# Patient Record
Sex: Female | Born: 1940
Health system: Southern US, Community
[De-identification: ages and names within clinical notes are randomized; demographics above are authoritative.]

## PROBLEM LIST (undated history)

## (undated) DIAGNOSIS — R55 Syncope and collapse: Secondary | ICD-10-CM

## (undated) DIAGNOSIS — R011 Cardiac murmur, unspecified: Secondary | ICD-10-CM

## (undated) DIAGNOSIS — H532 Diplopia: Secondary | ICD-10-CM

## (undated) DIAGNOSIS — G40909 Epilepsy, unspecified, not intractable, without status epilepticus: Secondary | ICD-10-CM

## (undated) DIAGNOSIS — F329 Major depressive disorder, single episode, unspecified: Secondary | ICD-10-CM

## (undated) DIAGNOSIS — F32A Depression, unspecified: Secondary | ICD-10-CM

## (undated) DIAGNOSIS — H9190 Unspecified hearing loss, unspecified ear: Secondary | ICD-10-CM

## (undated) DIAGNOSIS — R32 Unspecified urinary incontinence: Secondary | ICD-10-CM

## (undated) DIAGNOSIS — C801 Malignant (primary) neoplasm, unspecified: Secondary | ICD-10-CM

## (undated) DIAGNOSIS — M419 Scoliosis, unspecified: Secondary | ICD-10-CM

## (undated) DIAGNOSIS — R413 Other amnesia: Secondary | ICD-10-CM

## (undated) DIAGNOSIS — R269 Unspecified abnormalities of gait and mobility: Secondary | ICD-10-CM

## (undated) DIAGNOSIS — C3490 Malignant neoplasm of unspecified part of unspecified bronchus or lung: Secondary | ICD-10-CM

## (undated) DIAGNOSIS — G35 Multiple sclerosis: Principal | ICD-10-CM

## (undated) DIAGNOSIS — E7800 Pure hypercholesterolemia, unspecified: Secondary | ICD-10-CM

## (undated) DIAGNOSIS — J449 Chronic obstructive pulmonary disease, unspecified: Secondary | ICD-10-CM

## (undated) HISTORY — DX: Epilepsy, unspecified, not intractable, without status epilepticus: G40.909

## (undated) HISTORY — DX: Other amnesia: R41.3

## (undated) HISTORY — DX: Depression, unspecified: F32.A

## (undated) HISTORY — DX: Unspecified hearing loss, unspecified ear: H91.90

## (undated) HISTORY — DX: Malignant neoplasm of unspecified part of unspecified bronchus or lung: C34.90

## (undated) HISTORY — DX: Major depressive disorder, single episode, unspecified: F32.9

## (undated) HISTORY — DX: Unspecified urinary incontinence: R32

## (undated) HISTORY — DX: Diplopia: H53.2

## (undated) HISTORY — DX: Unspecified abnormalities of gait and mobility: R26.9

## (undated) HISTORY — DX: Scoliosis, unspecified: M41.9

## (undated) HISTORY — DX: Cardiac murmur, unspecified: R01.1

## (undated) HISTORY — DX: Pure hypercholesterolemia, unspecified: E78.00

## (undated) HISTORY — DX: Syncope and collapse: R55

---
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
## 1998-03-03 ENCOUNTER — Other Ambulatory Visit: Admission: RE | Admit: 1998-03-03 | Discharge: 1998-03-03 | Payer: Self-pay | Admitting: Neurology

## 2000-04-14 ENCOUNTER — Other Ambulatory Visit: Admission: RE | Admit: 2000-04-14 | Discharge: 2000-04-14 | Payer: Self-pay | Admitting: Family Medicine

## 2000-07-22 ENCOUNTER — Encounter (HOSPITAL_COMMUNITY): Admission: RE | Admit: 2000-07-22 | Discharge: 2000-10-20 | Payer: Self-pay | Admitting: *Deleted

## 2000-07-22 ENCOUNTER — Encounter: Admission: RE | Admit: 2000-07-22 | Discharge: 2000-08-21 | Payer: Self-pay

## 2004-11-25 HISTORY — PX: CHOLECYSTECTOMY: SHX55

## 2005-08-04 ENCOUNTER — Inpatient Hospital Stay (HOSPITAL_COMMUNITY): Admission: EM | Admit: 2005-08-04 | Discharge: 2005-08-07 | Payer: Self-pay | Admitting: Emergency Medicine

## 2005-08-06 ENCOUNTER — Encounter (INDEPENDENT_AMBULATORY_CARE_PROVIDER_SITE_OTHER): Payer: Self-pay | Admitting: Specialist

## 2005-09-20 ENCOUNTER — Other Ambulatory Visit: Admission: RE | Admit: 2005-09-20 | Discharge: 2005-09-20 | Payer: Self-pay | Admitting: Family Medicine

## 2005-11-25 HISTORY — PX: ABDOMINAL HYSTERECTOMY: SHX81

## 2006-01-15 ENCOUNTER — Encounter: Payer: Self-pay | Admitting: Emergency Medicine

## 2006-01-16 ENCOUNTER — Inpatient Hospital Stay (HOSPITAL_COMMUNITY): Admission: EM | Admit: 2006-01-16 | Discharge: 2006-01-24 | Payer: Self-pay | Admitting: Internal Medicine

## 2006-01-16 ENCOUNTER — Ambulatory Visit: Payer: Self-pay | Admitting: Internal Medicine

## 2006-11-25 HISTORY — PX: OVARIAN CYST REMOVAL: SHX89

## 2007-03-13 ENCOUNTER — Encounter: Admission: RE | Admit: 2007-03-13 | Discharge: 2007-03-13 | Payer: Self-pay | Admitting: Orthopaedic Surgery

## 2007-07-06 ENCOUNTER — Ambulatory Visit: Payer: Self-pay | Admitting: Psychology

## 2007-07-06 ENCOUNTER — Encounter: Admission: RE | Admit: 2007-07-06 | Discharge: 2007-07-22 | Payer: Self-pay | Admitting: Psychiatry

## 2007-07-06 ENCOUNTER — Encounter: Admission: RE | Admit: 2007-07-06 | Discharge: 2007-10-04 | Payer: Self-pay | Admitting: Psychology

## 2008-06-15 ENCOUNTER — Encounter: Admission: RE | Admit: 2008-06-15 | Discharge: 2008-08-17 | Payer: Self-pay | Admitting: *Deleted

## 2009-07-18 ENCOUNTER — Encounter: Admission: RE | Admit: 2009-07-18 | Discharge: 2009-10-02 | Payer: Self-pay | Admitting: Psychiatry

## 2010-11-06 ENCOUNTER — Encounter
Admission: RE | Admit: 2010-11-06 | Discharge: 2010-11-06 | Payer: Self-pay | Source: Home / Self Care | Attending: Geriatric Medicine | Admitting: Geriatric Medicine

## 2010-11-25 HISTORY — PX: LUNG BIOPSY: SHX232

## 2011-03-11 ENCOUNTER — Ambulatory Visit: Payer: Medicare Other | Attending: Orthopaedic Surgery | Admitting: Physical Therapy

## 2011-03-11 DIAGNOSIS — IMO0001 Reserved for inherently not codable concepts without codable children: Secondary | ICD-10-CM | POA: Insufficient documentation

## 2011-03-11 DIAGNOSIS — R262 Difficulty in walking, not elsewhere classified: Secondary | ICD-10-CM | POA: Insufficient documentation

## 2011-03-19 ENCOUNTER — Ambulatory Visit: Payer: Medicare Other | Admitting: Physical Therapy

## 2011-03-21 ENCOUNTER — Ambulatory Visit: Payer: Medicare Other | Admitting: Physical Therapy

## 2011-03-26 ENCOUNTER — Emergency Department (HOSPITAL_COMMUNITY): Payer: Medicare Other

## 2011-03-26 ENCOUNTER — Ambulatory Visit: Payer: BC Managed Care – PPO | Admitting: Physical Therapy

## 2011-03-26 ENCOUNTER — Emergency Department (HOSPITAL_COMMUNITY)
Admission: EM | Admit: 2011-03-26 | Discharge: 2011-03-27 | Disposition: A | Discharge status: Home / Self Care | Payer: Medicare Other | Attending: Emergency Medicine | Admitting: Emergency Medicine

## 2011-03-26 DIAGNOSIS — R0602 Shortness of breath: Secondary | ICD-10-CM | POA: Insufficient documentation

## 2011-03-26 DIAGNOSIS — G35 Multiple sclerosis: Secondary | ICD-10-CM | POA: Insufficient documentation

## 2011-03-26 DIAGNOSIS — R5381 Other malaise: Secondary | ICD-10-CM | POA: Insufficient documentation

## 2011-03-26 DIAGNOSIS — R4182 Altered mental status, unspecified: Secondary | ICD-10-CM | POA: Insufficient documentation

## 2011-03-26 DIAGNOSIS — I440 Atrioventricular block, first degree: Secondary | ICD-10-CM | POA: Insufficient documentation

## 2011-03-26 DIAGNOSIS — R209 Unspecified disturbances of skin sensation: Secondary | ICD-10-CM | POA: Insufficient documentation

## 2011-03-26 LAB — URINALYSIS, ROUTINE W REFLEX MICROSCOPIC
Bilirubin Urine: NEGATIVE
Ketones, ur: NEGATIVE mg/dL
Nitrite: NEGATIVE
Specific Gravity, Urine: 1.01 (ref 1.005–1.030)
Urobilinogen, UA: 0.2 mg/dL (ref 0.0–1.0)

## 2011-03-26 LAB — DIFFERENTIAL
Eosinophils Absolute: 0.3 10*3/uL (ref 0.0–0.7)
Lymphocytes Relative: 30 % (ref 12–46)
Lymphs Abs: 3.5 10*3/uL (ref 0.7–4.0)
Neutro Abs: 7 10*3/uL (ref 1.7–7.7)
Neutrophils Relative %: 60 % (ref 43–77)

## 2011-03-26 LAB — COMPREHENSIVE METABOLIC PANEL
BUN: 9 mg/dL (ref 6–23)
Calcium: 9.5 mg/dL (ref 8.4–10.5)
Glucose, Bld: 115 mg/dL — ABNORMAL HIGH (ref 70–99)
Sodium: 131 mEq/L — ABNORMAL LOW (ref 135–145)
Total Protein: 6.1 g/dL (ref 6.0–8.3)

## 2011-03-26 LAB — CBC
Hemoglobin: 16.3 g/dL — ABNORMAL HIGH (ref 12.0–15.0)
MCV: 96.1 fL (ref 78.0–100.0)
Platelets: 264 10*3/uL (ref 150–400)
RBC: 4.83 MIL/uL (ref 3.87–5.11)
WBC: 11.8 10*3/uL — ABNORMAL HIGH (ref 4.0–10.5)

## 2011-03-26 LAB — POCT CARDIAC MARKERS
CKMB, poc: <1 ng/mL — ABNORMAL LOW (ref 1.0–8.0)
Troponin i, poc: <0.05 ng/mL (ref 0.00–0.09)

## 2011-03-27 LAB — URINE CULTURE

## 2011-03-29 ENCOUNTER — Ambulatory Visit: Payer: Medicare Other | Attending: Orthopaedic Surgery | Admitting: Physical Therapy

## 2011-03-29 DIAGNOSIS — IMO0001 Reserved for inherently not codable concepts without codable children: Secondary | ICD-10-CM | POA: Insufficient documentation

## 2011-03-29 DIAGNOSIS — R262 Difficulty in walking, not elsewhere classified: Secondary | ICD-10-CM | POA: Insufficient documentation

## 2011-04-02 ENCOUNTER — Ambulatory Visit: Payer: Medicare Other | Admitting: Physical Therapy

## 2011-04-04 ENCOUNTER — Ambulatory Visit: Payer: Medicare Other | Admitting: Physical Therapy

## 2011-04-09 ENCOUNTER — Ambulatory Visit: Payer: Medicare Other | Admitting: Physical Therapy

## 2011-04-11 ENCOUNTER — Ambulatory Visit: Payer: Medicare Other | Admitting: Physical Therapy

## 2011-04-12 NOTE — Consult Note (Signed)
NAMEWENDOLYN, [name]               ACCOUNT NO.:  [id number]   MEDICAL RECORD NO.:  [id number]          PATIENT TYPE:  INP   LOCATION:  5003                         FACILITY:  [facility]   PHYSICIAN:  De Blanch, M.D.DATE OF BIRTH:  04/22/1941   DATE OF CONSULTATION:  01/21/2006  DATE OF DISCHARGE:                                   CONSULTATION   CHIEF COMPLAINT:  Abdominopelvic mass and abdominal pain.   A 70 year old white female admitted approximately a week ago with relatively  rapid onset of abdominal pain and abdominal distension. 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 She also had ascites  and adrenal nodules (the patient has a family history of two other siblings  with adrenal neoplasms which are benign). Following the hospital admission,  she has had fevers of uncertain origin and progressive respiratory decline.   The patient has a past history of multiple sclerosis which apparently has  been relatively stable over the past several years. She is primarily cared  for at [facility] in [place]. She also has longstanding chronic  obstructive pulmonary disease. Her O2 saturation throughout the admission  has worsened and workup including spiral CT to evaluate for pulmonary  embolism. Chest x-ray points towards worsening of her COPD. There is no  evidence of deep vein thrombosis or pulmonary embolus. She has 85%  saturation on 4 liters of nasal oxygen.   Infectious disease workup has suggested possibly a drug fever or tumor fever  as the etiology for her fever. Further, the patient is anemic with a  hematocrit 21%. She was initially admitted with an elevated white count of  close to 20,000 but has gradually declined to 14,000.   Today the patient complains of gastroesophageal reflux disease and not  having had a bowel movement in 2 days.   PAST  MEDICAL HISTORY:  Multiple sclerosis, emphysema, depression,  hyperlipidemia, mitral valve prolapse.   DRUG ALLERGIES:  SULFA.   PAST SURGICAL HISTORY:  Appendectomy, cholecystectomy, bilateral tubal  ligation.   CURRENT MEDICATIONS:  Are listed in the patient's medical record, are  reviewed and I have no additions. She is currently taking Zosyn.   SOCIAL HISTORY:  The patient has two Masters degrees, is divorced and comes  accompanied by her daughter. She quit smoking several weeks ago.   FAMILY HISTORY:  Negative for gynecologic, breast or colon cancer.   REVIEW OF SYSTEMS:  A 10-point comprehensive review of systems is performed  and is negative except as noted above.   OBSTETRICAL HISTORY:  Gravida 3, para 3-2-1.   She comes accompanied by her one living child today.   PHYSICAL EXAM:  VITAL SIGNS:  Temperature 100.2, pulse 80, respiratory rate  20.  GENERAL:  The patient is a pleasant, talkative white female in no acute  distress with nasal prongs in place.  HEENT:  Negative.  NECK:  Supple without thyromegaly. There is no supraclavicular or inguinal  adenopathy.  ABDOMEN:  The abdomen is distended. She  has a fluid wave and shifting  dullness. A discrete mass cannot be palpated because of the distension.  PELVIC:  EGBUS, vagina, bladder and urethra are normal. Cervix appears  normal. Bimanual exam and rectovaginal exam reveal a mass filling the pelvis  that is smooth and extends nearly to the umbilicus on bimanual examination.  This is minimally tender. There is no rebound  LOWER EXTREMITIES:  Without edema or varicosities.   IMPRESSION:  Complex pelvic mass with ascites. The patient's laboratory work  is reviewed. She is also anemic and has a slightly elevated white count. Her  CA-125 value is 12.4 units/mL.   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 Whether it is  malignant or benign is difficult to ascertain based on data available. Given  the  patient's symptoms, I believe she would benefit from surgical excision  of the mass with intraoperative frozen section and staging if this turned  out to be an ovarian cancer.   The complicating issues are her medical comorbidities including respiratory  compromise and multiple sclerosis. Given the high probability of the need  for postoperative intubation, I believe the patient would be best managed in  a major [facility]. As it turns out, she has two specialist  at [facility] in [place] and I would therefore recommend that  they be involved with managing her medical care.   The patient and her daughter are in agreement with this recommendation.      De Blanch, M.D.  Electronically Signed     DC/MEDQ  D:  01/21/2006  T:  01/22/2006  Job:  [id number]   cc:   Telford Nab, R.N.  [address]  [place], [place] [address]

## 2011-04-12 NOTE — Discharge Summary (Signed)
[name], [name]               ACCOUNT NO.:  [id number]   MEDICAL RECORD NO.:  [id number]          PATIENT TYPE:  INP   LOCATION:  5003                         FACILITY:  [facility]   PHYSICIAN:  Hollice Espy, M.D.DATE OF BIRTH:  1941-08-05   DATE OF ADMISSION:  01/16/2006  DATE OF DISCHARGE:  01/24/2006                                 DISCHARGE SUMMARY   ADDENDUM:  The patient's accepting physician at [facility] is Hazle Coca, M.D.   CONSULTATIONS:  Cliffton Asters, M.D., infectious disease.   DISCHARGE ADDENDUM:  The patient was initially scheduled to be transferred  to [facility] on January 23, 2006.  However, the issue of bed  availability at [facility] became an issue and the patient needed to  stay at least one additional day.  According to the patient's nurses,  nursing reports have called for sign-out on March 2, so the anticipated plan  is for transfer today, January 24, 2006.  The patient had no issues during the  additional hospital day except she did notice some occasional left arm  numbness.  She was concerned about her multiple sclerosis recurring, and so  her medications are being resumed, specifically Neurontin, Klonopin and  Betaseron.   Please note that on review of her discharge summary, her Betaseron was left  off of her medication list.  Please add this addendum medication to her list  as:   Betaseron 0.25 mg subcu every other day.  It is being given today, January 24, 2006.  Next dose scheduled is January 26, 2006.   The patient is otherwise doing well and felt to be medically stable for  discharge to [facility] as soon as a bed is available.      Hollice Espy, M.D.  Electronically Signed     SKK/MEDQ  D:  01/24/2006  T:  01/24/2006  Job:  [id number]

## 2011-04-12 NOTE — Op Note (Signed)
NAMEMIKELE, [name]               ACCOUNT NO.:  [id number]   MEDICAL RECORD NO.:  [id number]          PATIENT TYPE:  INP   LOCATION:  1619                         FACILITY:  [facility]   PHYSICIAN:  Vikki Ports, MDDATE OF BIRTH:  03-21-1941   DATE OF PROCEDURE:  08/06/2005  DATE OF DISCHARGE:                                 OPERATIVE REPORT   PREOPERATIVE DIAGNOSIS:  Cholelithiasis and choledocholithiasis.   POSTOPERATIVE DIAGNOSIS:  Cholelithiasis and choledocholithiasis.   PROCEDURE:  Laparoscopic cholecystectomy with intraoperative cholangiogram.   ASSISTANT:  Thornton Park. Daphine Deutscher, M.D.   ANESTHESIA:  General.   DESCRIPTION:  The patient was taken to the operating room and placed in a  supine position.  After adequate general anesthesia was induced using  endotracheal tube, the abdomen was prepped and draped in normal sterile  fashion.  Using a transverse infraumbilical incision, I dissected down to  the fascia.  Fascia was opened vertically and an 0 Vicryl pursestring suture  was placed around the fascial defect.  The Hasson trocar was placed in the  abdomen and pneumoperitoneum was obtained.  An 11 mm trocar was placed in  the subxiphoid region two 5 mm trocars were placed in right abdomen.  A  number of adhesions were taken down off the gallbladder and the gallbladder  was retracted cephalad.  A very large common duct was identified, as was a  fairly wide cystic duct.  Tedious dissection around the cystic duct  identified it at its junction with the gallbladder and the common duct.  It  was clipped proximally and a small ductotomy was made.  Cholangiogram was  performed which showed some probable sludge, which cleared into the  duodenum, a widened common bile duct but no evidence of filling defects.  The cystic duct was then triply clipped and divided as was the cystic  artery, which was dissected free in Calot's triangle in a similar fashion.  The gallbladder was taken  off the gallbladder bed using Bovie electrocautery  and removed through the umbilical port.  Of note, the gallbladder had to be  opened and some stones removed to remove it through the fascia.  The  incision was copiously irrigated.  Adequate hemostasis was assured.  Pneumoperitoneum was released.  Trocars were removed.  The fascial defect  was closed.  Skin incisions were closed with subcuticular 4-0 Monocryl.  Steri-Strips and sterile dressings were applied.  The patient tolerated the  procedure well and went to PACU in good condition.      Vikki Ports, MD  Electronically Signed     KRH/MEDQ  D:  08/06/2005  T:  08/07/2005  Job:  [id number]

## 2011-04-12 NOTE — H&P (Signed)
[name], [name]               ACCOUNT NO.:  [id number]   MEDICAL RECORD NO.:  [id number]          PATIENT TYPE:  INP   LOCATION:  NA                           FACILITY:  [facility]   PHYSICIAN:  Hollice Espy, M.D.DATE OF BIRTH:  06/09/1941   DATE OF ADMISSION:  01/16/2006  DATE OF DISCHARGE:                                HISTORY & PHYSICAL   CHIEF COMPLAINT:  Abdominal pain.   HISTORY OF PRESENT ILLNESS:  The patient is a 70 year old white female with  past medical history of mitral valve prolapse, hyperlipidemia, and multiple  sclerosis, as well as chronic obstructive pulmonary disease, who was sent  over from her primary care physicians office after having complaints of  abdominal pain, nausea and vomiting. The patient tells me that her appetite  has been off now for several weeks. Normally her bowels have been moving  well but over the last week, have not been moving at all. They have been  getting worse and worse. Her last full bowel movement that she could recall,  her last even small bowel movement, was at least 4 days ago and her last  major bowel movement was over a week ago. She started having significant  nausea and vomiting and her primary care physician was concerned about a  possible acute abdomen. He had the patient sent over by EMS here. Once in  the emergency room, the patient has noted to have significant constipation  by her abdominal x-ray. She received an enema and a significant bowel  movement. ER attending ordered a CT of the abdomen and pelvis and patient  was found on her pelvic CT, unfortunately, to have a very large pelvic mass  near the midline, measuring 11.6 x 10.7 x 15.5, looking multi-loculated and  cystic, compatible with ovarian cancer. Also of concern, were bilateral  adrenal nodules and ascites, concerning for metastasis. The patient in  addition was noted to have a white count of 20.4 with 92% shift. The rest of  her labs were essentially  unremarkable. Currently the patient, even after  having a bowel movement, still complains of some moderate abdominal  discomfort.   REVIEW OF SYSTEMS:  She denies any headaches, vision changes, dysphagia,  chest pain, palpitations, shortness of breath, wheezing, coughing. She  denies any hematuria, dysuria, no diarrhea. No focal extremity numbness,  weakness, or pain that is acute. Review of systems is otherwise negative.   PAST MEDICAL HISTORY:  Multiple sclerosis, emphysema, depression,  hyperlipidemia, and mitral valve prolapse.   PAST SURGICAL HISTORY:  Status post appendectomy and gallbladder surgery.  History of bilateral tubal ligation.   MEDICATIONS:  The patient is unsure of the doses of her medications. She is  on Wellbutrin, Aricept, Neurontin, Klonopin, Lipitor, Tricor, Trazodone,  Ibuprofen, Cymbalta, Provigil, and a medicine called Tysabri. She gets her  medications at the [facility].   ALLERGIES:  SULFA.   SOCIAL HISTORY:  She quit smoking a few weeks ago. She denies any alcohol or  drug use.   FAMILY HISTORY:  Notable for cancer, diabetes, heart disease, and  hypertension.   PHYSICAL EXAMINATION:  VITAL SIGNS:  On admission temperature 98.4, heart  rate 78, blood pressure 147/92, respiratory rate 20. O2 saturation 90% on 2  liters.  GENERAL:  Alert and oriented times three. In some mild distress secondary to  abdominal pain.  HEENT:  Normocephalic and atraumatic. Slightly dry mucous membranes.  NECK:  NO carotid bruits.  HEART:  Regular rate and rhythm with a small 2 out of 6 systolic ejection  murmur.  LUNGS:  She has some decreased breath sounds throughout.  ABDOMEN:  Distended, diffusely tender. Hypo-active bowel sounds and soft.  EXTREMITIES:  No clubbing, cyanosis. Trace pitting edema.   LABORATORY DATA:  White blood cell count 20.4 with a 90% shift. Hemoglobin  and hematocrit 16.4 and 47.9. MCV of 100, platelet count 259,000. Lipase   level 16. Liver function studies are normal. Sodium 131, potassium 4.1,  chloride 102, bicarb 22, BUN 14, creatinine 0.8, glucose 148. UA shows only  trace ketones.   ASSESSMENT/PLAN:  1.  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      Will check a CA125 level and in the morning, will consult Dr. Stanford Breed of Gynecologic Oncology Surgery.  2.  Leukocytosis, likely secondary to a stress reaction from large abdominal      mass. Will repeat her labs and follow. At this time, I do not see any      signs of indications of acute infection. She has a negative chest x-ray      and UA.  3.  Constipation. Will treat with stool softener and a Fleets enema. Will      try to sparingly use narcotics for her pain, given that this is further      leading to more constipation.  4.  Multiple sclerosis. The patient is unsure of her medications. Once her      pharmacy is open and the primary care physician's office is open, will      touch base with them and get her doses of her medicines.  5.  Hyperlipidemia.  6.  Depression.  7.  Emphysema.      Hollice Espy, M.D.  Electronically Signed     SKK/MEDQ  D:  01/16/2006  T:  01/16/2006  Job:  [id number]   cc:   Dellis Anes. Idell Pickles, M.D.  Fax: [phone]   De Blanch, M.D.  [address]  [place]  [place] [address]

## 2011-04-26 ENCOUNTER — Ambulatory Visit: Payer: Medicare Other | Attending: Orthopaedic Surgery | Admitting: Occupational Therapy

## 2011-04-26 DIAGNOSIS — R262 Difficulty in walking, not elsewhere classified: Secondary | ICD-10-CM | POA: Insufficient documentation

## 2011-04-26 DIAGNOSIS — IMO0001 Reserved for inherently not codable concepts without codable children: Secondary | ICD-10-CM | POA: Insufficient documentation

## 2011-04-30 ENCOUNTER — Ambulatory Visit: Payer: Medicare Other | Admitting: Occupational Therapy

## 2011-05-07 ENCOUNTER — Encounter: Payer: BLUE CROSS/BLUE SHIELD | Admitting: Occupational Therapy

## 2011-05-08 ENCOUNTER — Encounter: Payer: BLUE CROSS/BLUE SHIELD | Admitting: Occupational Therapy

## 2011-05-14 ENCOUNTER — Ambulatory Visit: Payer: Medicare Other | Admitting: Occupational Therapy

## 2011-05-16 ENCOUNTER — Encounter: Payer: BLUE CROSS/BLUE SHIELD | Admitting: Occupational Therapy

## 2011-05-21 ENCOUNTER — Ambulatory Visit: Payer: Medicare Other | Admitting: Occupational Therapy

## 2011-05-23 ENCOUNTER — Encounter: Payer: BLUE CROSS/BLUE SHIELD | Admitting: Occupational Therapy

## 2011-06-05 ENCOUNTER — Encounter: Payer: BLUE CROSS/BLUE SHIELD | Admitting: Occupational Therapy

## 2011-06-27 ENCOUNTER — Other Ambulatory Visit: Payer: Self-pay | Admitting: Pulmonary Disease

## 2011-06-27 DIAGNOSIS — R9389 Abnormal findings on diagnostic imaging of other specified body structures: Secondary | ICD-10-CM

## 2011-07-11 ENCOUNTER — Ambulatory Visit
Admission: RE | Admit: 2011-07-11 | Discharge: 2011-07-11 | Disposition: A | Discharge status: Home / Self Care | Payer: Medicare Other | Source: Ambulatory Visit | Attending: Pulmonary Disease | Admitting: Pulmonary Disease

## 2011-07-11 DIAGNOSIS — R9389 Abnormal findings on diagnostic imaging of other specified body structures: Secondary | ICD-10-CM

## 2011-09-04 ENCOUNTER — Other Ambulatory Visit: Payer: Self-pay | Admitting: Geriatric Medicine

## 2011-09-04 DIAGNOSIS — R748 Abnormal levels of other serum enzymes: Secondary | ICD-10-CM

## 2011-09-06 ENCOUNTER — Ambulatory Visit
Admission: RE | Admit: 2011-09-06 | Discharge: 2011-09-06 | Disposition: A | Discharge status: Home / Self Care | Payer: Medicare Other | Source: Ambulatory Visit | Attending: Geriatric Medicine | Admitting: Geriatric Medicine

## 2011-09-06 ENCOUNTER — Other Ambulatory Visit: Payer: Self-pay | Admitting: Geriatric Medicine

## 2011-09-06 DIAGNOSIS — R748 Abnormal levels of other serum enzymes: Secondary | ICD-10-CM

## 2011-10-10 DIAGNOSIS — Q2112 Patent foramen ovale: Secondary | ICD-10-CM | POA: Insufficient documentation

## 2012-07-27 ENCOUNTER — Ambulatory Visit (HOSPITAL_BASED_OUTPATIENT_CLINIC_OR_DEPARTMENT_OTHER): Payer: Medicare Other | Attending: Pulmonary Disease

## 2012-07-27 VITALS — Ht 63.5 in | Wt 149.0 lb

## 2012-07-27 DIAGNOSIS — G4733 Obstructive sleep apnea (adult) (pediatric): Secondary | ICD-10-CM

## 2012-07-27 DIAGNOSIS — G4730 Sleep apnea, unspecified: Secondary | ICD-10-CM | POA: Insufficient documentation

## 2012-07-27 DIAGNOSIS — G4700 Insomnia, unspecified: Secondary | ICD-10-CM | POA: Insufficient documentation

## 2012-08-08 DIAGNOSIS — G4730 Sleep apnea, unspecified: Secondary | ICD-10-CM

## 2012-08-08 DIAGNOSIS — G4700 Insomnia, unspecified: Secondary | ICD-10-CM

## 2012-08-09 NOTE — Procedures (Signed)
[name], [name]               ACCOUNT NO.:  [id number]  MEDICAL RECORD NO.:  [id number]          PATIENT TYPE:  OUT  LOCATION:  SLEEP CENTER                 FACILITY:  [facility]  PHYSICIAN:  Clinton D. Maple Hudson, MD, FCCP, FACPDATE OF BIRTH:  [birth year] 03, 1941  DATE OF STUDY:  07/27/2012                           NOCTURNAL POLYSOMNOGRAM  REFERRING PHYSICIAN:  RODOLPHO PASCUAL  INDICATION FOR STUDY:  Insomnia with sleep apnea.  EPWORTH SLEEPINESS SCORE:  9/24.  BMI 25.8, weight 148 pounds, height 63.5 inches, neck 14 inches.  MEDICATIONS:  Home medications are charted and reviewed.  SLEEP ARCHITECTURE:  Total sleep time 339 minutes with sleep efficiency 92.9%.  Stage I at 3.4%, stage II 80.4%, stage III 5%, REM 11.2% of total sleep time.  Sleep latency 21 minutes, REM latency 303 minutes. Awake after sleep onset 5 minutes.  Arousal index 9.9.  Bedtime Medication:  Trazodone 150 mg.  RESPIRATORY DATA:  Apnea-hypopnea index (AHI) 3 per hour.  A total of 17 events was scored including 15 obstructive apneas and 2 hypopneas. Events were seen in all sleep positions.  REM/AHI of 7.9 per hour.  This is a diagnostic NPSG protocol.  OXYGEN DATA:  Moderate snoring.  The study was recorded with the patient wearing oxygen at 2 L/minute, applied at 23:11 p.m. because of desaturation.  Oxygen desaturation while wearing oxygen was to a nadir of 83% with mean oxygen saturation through the study of 88.8%.  During the total recording time, 304.6 minutes were recorded with saturation less than 90% and 25.8 minutes with saturation less than 88% despite supplemental oxygen.  CARDIAC DATA:  Normal sinus rhythm.  MOVEMENT-PARASOMNIA:  A total of 76 limb jerks were scored of which 15 were associated with arousals or awakenings for periodic limb movement with arousal index of 2.7 per hour.  No bathroom trips.  IMPRESSIONS-RECOMMENDATIONS: 1. Sleep pattern reflected medication with trazodone at bedtime,  noting     little awakening throughout the study and relatively flat sustained     stage II sleep. 2. Occasional respiratory event with sleep disturbance, within normal     limits.  Apnea-hypopnea index (AHI) 3 per hour - normal range for     adult is from 0-5 events per hour. 3. She arrived with room air oxygen saturation while awake at 93%.     Despite addition of supplemental oxygen at 2 L/minute at 11:11     p.m., she had significant oxygen desaturation to a nadir of 83%     with a mean saturation through the study of 88.8% and a total of     25.8 minutes with oxygen saturation less than 88%.  Recommend re-     evaluation of oxygen therapy during sleep.  She may require 3 or 4     L/minute while sleeping. 4. Periodic limb movement with arousal.  A total of 76 limb jerks were     counted, of which 15 were associated with arousals or awakenings     for periodic limb movement with arousal index of 2.7 per hour.  If     relief of nocturnal hypoxemia does not improve sleep quality     sufficiently, then  a trial of specific therapy such as Requip or     Mirapex might be considered if clinically appropriate.     Clinton D. Maple Hudson, MD, [facility], FACP Diplomate, American Board of Sleep Medicine    CDY/MEDQ  D:  08/08/2012 08:38:38  T:  08/09/2012 03:31:32  Job:  [id number]

## 2012-11-09 ENCOUNTER — Other Ambulatory Visit: Payer: Self-pay | Admitting: Orthopaedic Surgery

## 2012-11-09 DIAGNOSIS — I700 Atherosclerosis of aorta: Secondary | ICD-10-CM

## 2012-11-11 ENCOUNTER — Ambulatory Visit
Admission: RE | Admit: 2012-11-11 | Discharge: 2012-11-11 | Disposition: A | Discharge status: Home / Self Care | Payer: Medicare Other | Source: Ambulatory Visit | Attending: Orthopaedic Surgery | Admitting: Orthopaedic Surgery

## 2012-11-11 DIAGNOSIS — I700 Atherosclerosis of aorta: Secondary | ICD-10-CM

## 2013-01-12 ENCOUNTER — Ambulatory Visit: Payer: Medicare Other | Attending: Orthopaedic Surgery | Admitting: Physical Therapy

## 2013-01-12 DIAGNOSIS — R262 Difficulty in walking, not elsewhere classified: Secondary | ICD-10-CM | POA: Insufficient documentation

## 2013-01-12 DIAGNOSIS — R5381 Other malaise: Secondary | ICD-10-CM | POA: Insufficient documentation

## 2013-01-12 DIAGNOSIS — M545 Low back pain, unspecified: Secondary | ICD-10-CM | POA: Insufficient documentation

## 2013-01-12 DIAGNOSIS — IMO0001 Reserved for inherently not codable concepts without codable children: Secondary | ICD-10-CM | POA: Insufficient documentation

## 2013-01-14 ENCOUNTER — Ambulatory Visit: Payer: Medicare Other | Admitting: Physical Therapy

## 2013-01-19 ENCOUNTER — Ambulatory Visit: Payer: Medicare Other | Admitting: Physical Therapy

## 2013-01-21 ENCOUNTER — Ambulatory Visit: Payer: Medicare Other | Admitting: Physical Therapy

## 2013-01-26 ENCOUNTER — Ambulatory Visit: Payer: Medicare Other | Attending: Orthopaedic Surgery | Admitting: Physical Therapy

## 2013-01-26 DIAGNOSIS — IMO0001 Reserved for inherently not codable concepts without codable children: Secondary | ICD-10-CM | POA: Insufficient documentation

## 2013-01-26 DIAGNOSIS — M545 Low back pain, unspecified: Secondary | ICD-10-CM | POA: Insufficient documentation

## 2013-01-26 DIAGNOSIS — R262 Difficulty in walking, not elsewhere classified: Secondary | ICD-10-CM | POA: Insufficient documentation

## 2013-01-26 DIAGNOSIS — R5381 Other malaise: Secondary | ICD-10-CM | POA: Insufficient documentation

## 2013-01-28 ENCOUNTER — Ambulatory Visit: Payer: Medicare Other | Admitting: Physical Therapy

## 2013-02-02 ENCOUNTER — Ambulatory Visit: Payer: Medicare Other | Admitting: Physical Therapy

## 2013-02-04 ENCOUNTER — Encounter (HOSPITAL_COMMUNITY): Payer: Self-pay

## 2013-02-04 ENCOUNTER — Encounter: Payer: Medicare Other | Admitting: Physical Therapy

## 2013-02-04 ENCOUNTER — Inpatient Hospital Stay (HOSPITAL_COMMUNITY)
Admission: EM | Admit: 2013-02-04 | Discharge: 2013-02-05 | DRG: 059 | Disposition: A | Discharge status: Home Health | Payer: Medicare Other | Attending: Internal Medicine | Admitting: Internal Medicine

## 2013-02-04 ENCOUNTER — Emergency Department (HOSPITAL_COMMUNITY): Payer: Medicare Other

## 2013-02-04 ENCOUNTER — Inpatient Hospital Stay (HOSPITAL_COMMUNITY): Payer: Medicare Other

## 2013-02-04 DIAGNOSIS — F329 Major depressive disorder, single episode, unspecified: Secondary | ICD-10-CM | POA: Diagnosis present

## 2013-02-04 DIAGNOSIS — Z90710 Acquired absence of both cervix and uterus: Secondary | ICD-10-CM

## 2013-02-04 DIAGNOSIS — J4489 Other specified chronic obstructive pulmonary disease: Secondary | ICD-10-CM | POA: Diagnosis present

## 2013-02-04 DIAGNOSIS — Z923 Personal history of irradiation: Secondary | ICD-10-CM

## 2013-02-04 DIAGNOSIS — G35 Multiple sclerosis: Principal | ICD-10-CM | POA: Diagnosis present

## 2013-02-04 DIAGNOSIS — Z9089 Acquired absence of other organs: Secondary | ICD-10-CM

## 2013-02-04 DIAGNOSIS — E785 Hyperlipidemia, unspecified: Secondary | ICD-10-CM | POA: Diagnosis present

## 2013-02-04 DIAGNOSIS — J9610 Chronic respiratory failure, unspecified whether with hypoxia or hypercapnia: Secondary | ICD-10-CM | POA: Diagnosis present

## 2013-02-04 DIAGNOSIS — F17200 Nicotine dependence, unspecified, uncomplicated: Secondary | ICD-10-CM | POA: Diagnosis present

## 2013-02-04 DIAGNOSIS — F419 Anxiety disorder, unspecified: Secondary | ICD-10-CM | POA: Diagnosis present

## 2013-02-04 DIAGNOSIS — G4700 Insomnia, unspecified: Secondary | ICD-10-CM | POA: Diagnosis present

## 2013-02-04 DIAGNOSIS — Z79899 Other long term (current) drug therapy: Secondary | ICD-10-CM

## 2013-02-04 DIAGNOSIS — R5383 Other fatigue: Secondary | ICD-10-CM | POA: Diagnosis present

## 2013-02-04 DIAGNOSIS — E876 Hypokalemia: Secondary | ICD-10-CM

## 2013-02-04 DIAGNOSIS — R5381 Other malaise: Secondary | ICD-10-CM | POA: Diagnosis present

## 2013-02-04 DIAGNOSIS — F411 Generalized anxiety disorder: Secondary | ICD-10-CM

## 2013-02-04 DIAGNOSIS — Z882 Allergy status to sulfonamides status: Secondary | ICD-10-CM

## 2013-02-04 DIAGNOSIS — Z888 Allergy status to other drugs, medicaments and biological substances status: Secondary | ICD-10-CM

## 2013-02-04 DIAGNOSIS — F3289 Other specified depressive episodes: Secondary | ICD-10-CM | POA: Diagnosis present

## 2013-02-04 DIAGNOSIS — J309 Allergic rhinitis, unspecified: Secondary | ICD-10-CM | POA: Diagnosis present

## 2013-02-04 DIAGNOSIS — J449 Chronic obstructive pulmonary disease, unspecified: Secondary | ICD-10-CM | POA: Diagnosis present

## 2013-02-04 DIAGNOSIS — Z85118 Personal history of other malignant neoplasm of bronchus and lung: Secondary | ICD-10-CM

## 2013-02-04 DIAGNOSIS — R29898 Other symptoms and signs involving the musculoskeletal system: Secondary | ICD-10-CM | POA: Diagnosis present

## 2013-02-04 HISTORY — DX: Chronic obstructive pulmonary disease, unspecified: J44.9

## 2013-02-04 HISTORY — DX: Multiple sclerosis: G35

## 2013-02-04 HISTORY — DX: Malignant (primary) neoplasm, unspecified: C80.1

## 2013-02-04 LAB — URINALYSIS, ROUTINE W REFLEX MICROSCOPIC
Ketones, ur: NEGATIVE mg/dL
Leukocytes, UA: NEGATIVE
Protein, ur: NEGATIVE mg/dL
Urobilinogen, UA: 0.2 mg/dL (ref 0.0–1.0)

## 2013-02-04 LAB — CBC
HCT: 48.1 % — ABNORMAL HIGH (ref 36.0–46.0)
Hemoglobin: 16.5 g/dL — ABNORMAL HIGH (ref 12.0–15.0)
MCH: 34.5 pg — ABNORMAL HIGH (ref 26.0–34.0)
MCH: 34.9 pg — ABNORMAL HIGH (ref 26.0–34.0)
MCHC: 34.6 g/dL (ref 30.0–36.0)
MCHC: 34.3 g/dL (ref 30.0–36.0)
Platelets: 215 10*3/uL (ref 150–400)
RBC: 4.61 MIL/uL (ref 3.87–5.11)
RBC: 4.73 MIL/uL (ref 3.87–5.11)
RDW: 12.9 % (ref 11.5–15.5)

## 2013-02-04 LAB — CREATININE, SERUM: Creatinine, Ser: 0.53 mg/dL (ref 0.50–1.10)

## 2013-02-04 LAB — HEPATIC FUNCTION PANEL
AST: 19 U/L (ref 0–37)
Albumin: 3.4 g/dL — ABNORMAL LOW (ref 3.5–5.2)
Alkaline Phosphatase: 119 U/L — ABNORMAL HIGH (ref 39–117)
Total Protein: 6.4 g/dL (ref 6.0–8.3)

## 2013-02-04 LAB — BASIC METABOLIC PANEL
Calcium: 9 mg/dL (ref 8.4–10.5)
GFR calc Af Amer: >90 mL/min (ref >90)
GFR calc non Af Amer: 90 mL/min — ABNORMAL LOW (ref >90)
Sodium: 140 mEq/L (ref 135–145)

## 2013-02-04 LAB — GLUCOSE, CAPILLARY
Glucose-Capillary: 131 mg/dL — ABNORMAL HIGH (ref 70–99)
Glucose-Capillary: 173 mg/dL — ABNORMAL HIGH (ref 70–99)

## 2013-02-04 LAB — PHOSPHORUS: Phosphorus: 3.2 mg/dL (ref 2.3–4.6)

## 2013-02-04 MED ORDER — INSULIN ASPART 100 UNIT/ML ~~LOC~~ SOLN
0.0000 [IU] | Freq: Three times a day (TID) | SUBCUTANEOUS | Status: DC
  Administered 2013-02-05: 3 [IU] via SUBCUTANEOUS

## 2013-02-04 MED ORDER — ENOXAPARIN SODIUM 40 MG/0.4ML ~~LOC~~ SOLN
40.0000 mg | SUBCUTANEOUS | Status: DC
  Administered 2013-02-04: 40 mg via SUBCUTANEOUS
  Filled 2013-02-04 (×2): qty 0.4

## 2013-02-04 MED ORDER — TRAZODONE HCL 150 MG PO TABS
150.0000 mg | ORAL_TABLET | Freq: Every day | ORAL | Status: DC
  Administered 2013-02-04: 150 mg via ORAL
  Filled 2013-02-04 (×2): qty 1

## 2013-02-04 MED ORDER — ALBUTEROL SULFATE HFA 108 (90 BASE) MCG/ACT IN AERS: 2.0000 | INHALATION_SPRAY | Freq: Four times a day (QID) | RESPIRATORY_TRACT | Status: DC | PRN

## 2013-02-04 MED ORDER — METHYLPREDNISOLONE SODIUM SUCC 125 MG IJ SOLR
125.0000 mg | Freq: Once | INTRAMUSCULAR | Status: AC
  Administered 2013-02-04: 125 mg via INTRAVENOUS
  Filled 2013-02-04: qty 2

## 2013-02-04 MED ORDER — POTASSIUM CHLORIDE CRYS ER 20 MEQ PO TBCR
40.0000 meq | EXTENDED_RELEASE_TABLET | Freq: Once | ORAL | Status: AC
  Administered 2013-02-04: 40 meq via ORAL
  Filled 2013-02-04: qty 2

## 2013-02-04 MED ORDER — ONDANSETRON HCL 4 MG/2ML IJ SOLN: 4.0000 mg | Freq: Four times a day (QID) | INTRAMUSCULAR | Status: DC | PRN

## 2013-02-04 MED ORDER — TIOTROPIUM BROMIDE MONOHYDRATE 18 MCG IN CAPS
18.0000 ug | ORAL_CAPSULE | Freq: Every day | RESPIRATORY_TRACT | Status: DC
  Administered 2013-02-05: 18 ug via RESPIRATORY_TRACT
  Filled 2013-02-04: qty 5

## 2013-02-04 MED ORDER — INSULIN ASPART 100 UNIT/ML ~~LOC~~ SOLN: 0.0000 [IU] | Freq: Every day | SUBCUTANEOUS | Status: DC

## 2013-02-04 MED ORDER — GABAPENTIN 300 MG PO CAPS
900.0000 mg | ORAL_CAPSULE | Freq: Four times a day (QID) | ORAL | Status: DC
  Administered 2013-02-04 – 2013-02-05 (×2): 900 mg via ORAL
  Filled 2013-02-04 (×5): qty 3

## 2013-02-04 MED ORDER — SODIUM CHLORIDE 0.9 % IV SOLN
INTRAVENOUS | Status: AC
  Administered 2013-02-04: 22:00:00 via INTRAVENOUS

## 2013-02-04 MED ORDER — SODIUM CHLORIDE 0.9 % IV SOLN
500.0000 mg | Freq: Two times a day (BID) | INTRAVENOUS | Status: DC
  Administered 2013-02-04 – 2013-02-05 (×2): 500 mg via INTRAVENOUS
  Filled 2013-02-04 (×3): qty 4

## 2013-02-04 MED ORDER — MORPHINE SULFATE 2 MG/ML IJ SOLN: 1.0000 mg | INTRAMUSCULAR | Status: DC | PRN

## 2013-02-04 MED ORDER — VENLAFAXINE HCL ER 150 MG PO CP24
300.0000 mg | ORAL_CAPSULE | Freq: Every day | ORAL | Status: DC
  Administered 2013-02-05: 300 mg via ORAL
  Filled 2013-02-04: qty 2

## 2013-02-04 MED ORDER — ATORVASTATIN CALCIUM 20 MG PO TABS
20.0000 mg | ORAL_TABLET | Freq: Every day | ORAL | Status: DC
  Administered 2013-02-05: 20 mg via ORAL
  Filled 2013-02-04 (×2): qty 1

## 2013-02-04 MED ORDER — GUAIFENESIN ER 600 MG PO TB12
1200.0000 mg | ORAL_TABLET | Freq: Two times a day (BID) | ORAL | Status: DC
  Administered 2013-02-05: 1200 mg via ORAL
  Filled 2013-02-04 (×3): qty 2

## 2013-02-04 MED ORDER — CLONAZEPAM 0.5 MG PO TABS: 2.0000 mg | ORAL_TABLET | Freq: Three times a day (TID) | ORAL | Status: DC | PRN

## 2013-02-04 MED ORDER — ACETAMINOPHEN 325 MG PO TABS: 650.0000 mg | ORAL_TABLET | Freq: Four times a day (QID) | ORAL | Status: DC | PRN

## 2013-02-04 MED ORDER — ACETAMINOPHEN 650 MG RE SUPP: 650.0000 mg | Freq: Four times a day (QID) | RECTAL | Status: DC | PRN

## 2013-02-04 MED ORDER — GADOBENATE DIMEGLUMINE 529 MG/ML IV SOLN
13.0000 mL | Freq: Once | INTRAVENOUS | Status: AC
  Administered 2013-02-04: 13 mL via INTRAVENOUS

## 2013-02-04 MED ORDER — LORATADINE 10 MG PO TABS
10.0000 mg | ORAL_TABLET | Freq: Every day | ORAL | Status: DC
  Administered 2013-02-05: 10 mg via ORAL
  Filled 2013-02-04 (×2): qty 1

## 2013-02-04 MED ORDER — ONDANSETRON HCL 4 MG PO TABS: 4.0000 mg | ORAL_TABLET | Freq: Four times a day (QID) | ORAL | Status: DC | PRN

## 2013-02-04 MED ORDER — OXYCODONE HCL 5 MG PO TABS: 5.0000 mg | ORAL_TABLET | ORAL | Status: DC | PRN

## 2013-02-04 NOTE — Consult Note (Signed)
Reason for Consult: multiple sclerosis flair Referring Physician: Dr. Gwenlyn Perking  CC: difficulty walking  HPI: [name] is an 72 y.o. female who was diagnosed with MS about 23 years ago. She has not had a flair in over 2 years ago. Yesterday she awoke with inability to walk. She was able to crawl around on the floor. She remains equally weak in upper extremities. She called her primary neurologist, Dr. Harriette Bouillon (in [facility], [facility] of [facility]) who advised her today to come to ED. She did take betaseron for years but this ended up not helping. She qualified for provigil assistance for 1 month; however, then it was not covered and could not afford it.  She has a history of lung cancer, 2012 which was treated with rad tx, 5 courses. Remains a smoker with COPD/emphysema.  Past Medical History  Diagnosis Date  . Multiple sclerosis   . COPD (chronic obstructive pulmonary disease)   . Cancer     Past Surgical History  Procedure Laterality Date  . Abdominal hysterectomy    . Ovarian cyst removal    . Cholecystectomy    . Cesarean section    . Appendectomy      Family history: no strokes or MS  Social History: +smoker, no etoh or illicit drug use.  Allergies  Allergen Reactions  . Dilantin (Phenytoin) Hives  . Lactose Intolerance (Gi) Diarrhea  . Sulfa Antibiotics Hives    Current Facility-Administered Medications  Medication Dose Route Frequency Juma Oxley Last Rate Last Dose  . [START ON 02/05/2013] insulin aspart (novoLOG) injection 0-15 Units  0-15 Units Subcutaneous TID WC Vassie Loll, MD      . insulin aspart (novoLOG) injection 0-5 Units  0-5 Units Subcutaneous QHS Vassie Loll, MD      . methylPREDNISolone sodium succinate (SOLU-MEDROL) 500 mg in sodium chloride 0.9 % 50 mL IVPB  500 mg Intravenous Q12H Vassie Loll, MD       Current Outpatient Prescriptions  Medication Sig Dispense Refill  . acetaminophen (TYLENOL) 500 MG tablet Take 1,500 mg by mouth as needed  for pain (up to 4,000 mg).      Marland Kitchen albuterol (PROVENTIL HFA;VENTOLIN HFA) 108 (90 BASE) MCG/ACT inhaler Inhale 2 puffs into the lungs every 6 (six) hours as needed for wheezing.      Marland Kitchen atorvastatin (LIPITOR) 20 MG tablet Take 20 mg by mouth daily.      . clonazePAM (KLONOPIN) 2 MG tablet Take 2 mg by mouth every 6 (six) hours as needed for anxiety.      . fexofenadine (ALLEGRA) 180 MG tablet Take 180 mg by mouth daily.      Marland Kitchen gabapentin (NEURONTIN) 300 MG capsule Take 900 mg by mouth 4 (four) times daily.      Marland Kitchen guaiFENesin (MUCINEX) 600 MG 12 hr tablet Take 1,200 mg by mouth 2 (two) times daily.      Marland Kitchen tiotropium (SPIRIVA) 18 MCG inhalation capsule Place 18 mcg into inhaler and inhale daily.      . traZODone (DESYREL) 150 MG tablet Take 150 mg by mouth at bedtime.      Marland Kitchen venlafaxine XR (EFFEXOR-XR) 150 MG 24 hr capsule Take 300 mg by mouth daily.       ROS: History obtained from child, chart review and the patient  General ROS: negative for - chills, fatigue, fever, night sweats, weight gain or weight loss Psychological ROS: negative for - behavioral disorder, hallucinations, memory difficulties, mood swings or suicidal ideation Ophthalmic ROS: negative for -  blurry vision, double vision, eye pain or loss of vision ENT ROS: negative for - epistaxis, nasal discharge, oral lesions, sore throat, tinnitus or vertigo Allergy and Immunology ROS: negative for - hives or itchy/watery eyes Hematological and Lymphatic ROS: negative for - bleeding problems, bruising or swollen lymph nodes Endocrine ROS: negative for - galactorrhea, hair pattern changes, polydipsia/polyuria or temperature intolerance Respiratory ROS: negative for - cough, hemoptysis, shortness of breath or wheezing Cardiovascular ROS: negative for - chest pain, dyspnea on exertion, edema or irregular heartbeat Gastrointestinal ROS: negative for - abdominal pain, diarrhea, hematemesis, nausea/vomiting or stool incontinence Genito-Urinary  ROS: negative for - dysuria, hematuria, incontinence or urinary frequency/urgency Musculoskeletal ROS: negative for - joint swelling. ++muscular weakness Neurological ROS: as noted in HPI Dermatological ROS: negative for rash and skin lesion changes  Physical Examination: Blood pressure 152/70, pulse 61, temperature 98.1 F (36.7 C), temperature source Oral, resp. rate 18, SpO2 94.00%.  Neurologic Examination Mental Status: Alert, oriented, thought content appropriate.  Speech fluent without evidence of aphasia.  Able to follow 3 step commands without difficulty. Cranial Nerves: II: visual fields grossly normal, pupils equal, round, reactive to light and accommodation III,IV, VI: ptosis not present, extra-ocular motions intact bilaterally V,VII: smile symmetric, facial light touch sensation normal bilaterally VIII: hearing normal bilaterally IX,X: gag reflex present XI: trapezius strength/neck flexion strength normal bilaterally XII: tongue strength normal  Motor: Right : Upper extremity   4/5    Left:     Upper extremity   4/5  Lower extremity   4-/5     Lower extremity   4-/5 Tone and bulk:normal tone throughout; no atrophy noted Sensory: Pinprick and light touch intact throughout, bilaterally Deep Tendon Reflexes: 2+ and symmetric throughout Plantars: equivocal Cerebellar: normal finger-to-nose. Gait not tested due to patient safety concerns.   Results for orders placed during the hospital encounter of 02/04/13 (from the past 48 hour(s))  CBC     Status: Abnormal   Collection Time    02/04/13 12:15 PM      Result Value Range   WBC 9.4  4.0 - 10.5 K/uL   RBC 4.61  3.87 - 5.11 MIL/uL   Hemoglobin 15.9 (*) 12.0 - 15.0 g/dL   HCT 16.1  09.6 - 04.5 %   MCV 99.6  78.0 - 100.0 fL   MCH 34.5 (*) 26.0 - 34.0 pg   MCHC 34.6  30.0 - 36.0 g/dL   RDW 40.9  81.1 - 91.4 %   Platelets 215  150 - 400 K/uL  BASIC METABOLIC PANEL     Status: Abnormal   Collection Time    02/04/13 12:15  PM      Result Value Range   Sodium 140  135 - 145 mEq/L   Potassium 3.4 (*) 3.5 - 5.1 mEq/L   Chloride 106  96 - 112 mEq/L   CO2 26  19 - 32 mEq/L   Glucose, Bld 139 (*) 70 - 99 mg/dL   BUN 9  6 - 23 mg/dL   Creatinine, Ser 7.82  0.50 - 1.10 mg/dL   Calcium 9.0  8.4 - 95.6 mg/dL   GFR calc non Af Amer 90 (*) >90 mL/min   GFR calc Af Amer >90  >90 mL/min   Comment:            The eGFR has been calculated     using the CKD EPI equation.     This calculation has not been     validated  in all clinical     situations.     eGFR's persistently     <90 mL/min signify     possible Chronic Kidney Disease.  URINALYSIS, ROUTINE W REFLEX MICROSCOPIC     Status: None   Collection Time    02/04/13  1:27 PM      Result Value Range   Color, Urine YELLOW  YELLOW   APPearance CLEAR  CLEAR   Specific Gravity, Urine 1.009  1.005 - 1.030   pH 6.0  5.0 - 8.0   Glucose, UA NEGATIVE  NEGATIVE mg/dL   Hgb urine dipstick NEGATIVE  NEGATIVE   Bilirubin Urine NEGATIVE  NEGATIVE   Ketones, ur NEGATIVE  NEGATIVE mg/dL   Protein, ur NEGATIVE  NEGATIVE mg/dL   Urobilinogen, UA 0.2  0.0 - 1.0 mg/dL   Nitrite NEGATIVE  NEGATIVE   Leukocytes, UA NEGATIVE  NEGATIVE   Comment: MICROSCOPIC NOT DONE ON URINES WITH NEGATIVE PROTEIN, BLOOD, LEUKOCYTES, NITRITE, OR GLUCOSE <1000 mg/dL.    No results found for this or any previous visit (from the past 240 hour(s)).  Dg Lumbar Spine Complete  02/04/2013  *RADIOLOGY REPORT*  Clinical Data: Low back pain.  Bilateral lower extremity weakness.  LUMBAR SPINE - COMPLETE 4+ VIEW  Comparison: MRI lumbar spine 11/16/2012.  Findings: Five non-rib bearing lumbar vertebrae with anatomic alignment.  No fractures.  Disc space narrowing and endplate hypertrophic changes at L3-4 and L4-5, unchanged from the MR.  No pars defects.  Facet degenerative changes at L3-4, L4-5 and L5 S1. Visualized sacroiliac joints intact.  Aorto-iliac atherosclerosis without aneurysm.  IMPRESSION: No  acute osseous abnormality.  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  Facet degenerative changes at L3-4, L4-5, and L5-S1.   Original Report Authenticated By: Hulan Saas, M.D.    Job Founds, MBA, MHA [facility] Pager [phone]  02/04/2013, 6:16 PM   Patient seen and examined.  Clinical course and management discussed.  Necessary edits performed.  I agree with the above.  Assessment and plan of care developed and discussed below.    Assessment/Plan: 72 year old female with MS followed by Dr. Lin Givens.  Presents with BLE weakness.  MS flare likely.  Imaging of the lumbar spine shows no evidence of compression.  MR of brain pending.    Recommendations: 1. PT/OT 2. Solumedrol 1000mg  IV daily 3. Will follow up MRI of the brain with and without contrast   Thana Farr, MD [facility] [phone]  02/04/2013  7:10 PM

## 2013-02-04 NOTE — ED Notes (Signed)
Pt presents with bilateral leg weakness since falling yesterday.  Pt reports recent injury where she fell during the snowstorm, and has been going through rehab for that injury.  Pt reports no difficulty walking on Tuesday during or after rehab.  Pt reports awakening on Wednesday, got up and fell.  Pt able to crawl to get off the floor, reports having to hold on objects in order to ambulate.

## 2013-02-04 NOTE — ED Notes (Signed)
Swallow screen done prior to administration of oral medication

## 2013-02-04 NOTE — ED Notes (Signed)
Pt transported to and from radiology on stretcher with tech, tolerated well.  

## 2013-02-04 NOTE — ED Notes (Signed)
Oscar La, RN of delay in transport to floor/pt in radiology at this time

## 2013-02-04 NOTE — ED Notes (Signed)
Pt ambulated with two person assistance; pt walked approximately 15 feet from door and grew tired quickly as well as very unstable with gait.  Reported assessment to Dr. Lorenso Courier for evaluation of findings.

## 2013-02-04 NOTE — Progress Notes (Signed)
   CARE MANAGEMENT ED NOTE 02/04/2013  Patient:  [name], [name]   Account Number:  [id number]  Date Initiated:  02/04/2013  Documentation initiated by:  Fransico Michael  Subjective/Objective Assessment:   presented to ED with c/o flank pain     Subjective/Objective Assessment Detail:     Action/Plan:   requiring assistance at home after discharge. MS flare up   Action/Plan Detail:   Anticipated DC Date:  02/04/2013     Status Recommendation to Physician:   Result of Recommendation:     In-house referral  Clinical Social Worker   DC Planning Services  CM consult   [facility]  HOME HEALTH   Choice offered to / List presented to:  C-1 Patient     HH arranged  HH-1 RN  HH-2 PT  HH-3 OT  HH-4 NURSE'S AIDE  HH-6 SOCIAL WORKER       Status of service:  Completed, signed off  ED Comments:   ED Comments Detail:  02/04/13-1511-J.Minnich,RN,BSN [phone]      Donna,RN with [facility] notified of referral and patient information. No further needs identified. Patient to be discharged home today.  02/04/13-1506-J.Minnich,RN,BSN [phone]      Spoke with Dr. Lorenso Courier regarding disposition to home. Patient not to be admitted. Requested that patient be sent home with home health services. Agreed. RN,OT,PT,Aide, and CSW ordered.  02/04/13-1456-J.Minnich,RN,BSn [phone]      Notified by Jody,CSW that patient is amenable to home health and would benefit from home health services. Choice of agencies offered. [facility] and appropriate referral will be made. Reports that patient lives in an independent living facility.

## 2013-02-04 NOTE — H&P (Addendum)
[facility] History and Physical  [name] UJW:[id number] DOB: Jan 10, 1941 DOA: 02/04/2013  Referring physician: Dr. Lorenso Courier PCP: Stephanie Otto, MD  Specialists: Dr. Tinnie Gens (neurologist)  Chief Complaint: Bilateral lower extremities weakness, falls, inability to walk  HPI: [name] is a 72 y.o. female past medical history significant for COPD (chronic respiratory failure), multiple sclerosis, depression, anxiety and hyperlipidemia; came to the hospital secondary to acute lower extremities weakness, falls and inability to walk. Patient reports that she has been now unable to walk more than a few steps without crumbling to the ground, Asian endorses no significant pain and Jos her next given out. This is a condition that has been slowly but progressively worsening over the last couple of weeks to the point that for the last 3 days she has not really been able to perform her activities of daily living at home by herself. Patient reports that she had a history of 20 years of multiple sclerosis followed by Dr. Tinnie Gens is an outpatient and that she has experienced multiple exacerbations in the past that made her walking ability extremely difficult (similar to what she is experiencing now). Patient also with degenerative lumbar spine disease 4 she has been receiving in the last couple of month to steroids injection to alleviate pain. She endorses episodes of falling especially with the snowstorm recently but overall able to participate in outpatient rehabilitation sections up to 2 days prior to admission. Patient denies any fever, dysuria, shortness of breath, chest pain, palpitations, headaches, nausea/vomiting/diarrhea, melena, any rashes or other acute complaints associated with her symptoms. Patient reports that she has never been able to use a cane or a walker because of the extremity nerve issues and weakness in the past.  Initial assessment and blood work in the ED was essentially  normal, [facility] has been called to admit the patient for multiple sclerosis exacerbation and further evaluation and treatment.   Review of Systems:  Negative except as otherwise mentioned on history of present illness.  Past Medical History  Diagnosis Date  . Multiple sclerosis   . COPD (chronic obstructive pulmonary disease)   . Cancer    Past Surgical History  Procedure Laterality Date  . Abdominal hysterectomy    . Ovarian cyst removal    . Cholecystectomy    . Cesarean section    . Appendectomy     Social History:  reports that she has been smoking.  She does not have any smokeless tobacco history on file. She reports that she does not drink alcohol or use illicit drugs. patient reside in an independent living community; reports some difficulty performing her activities of daily living do to ongoing exacerbation of her multiple sclerosis according to the patient otherwise was living by herself with the company of her service dog.  Allergies  Allergen Reactions  . Dilantin (Phenytoin) Hives  . Lactose Intolerance (Gi) Diarrhea  . Sulfa Antibiotics Hives    Family history: Significant for hypertension otherwise noncontributory according to patient.  Prior to Admission medications   Medication Sig Start Date End Date Taking? Authorizing Provider  acetaminophen (TYLENOL) 500 MG tablet Take 1,500 mg by mouth as needed for pain (up to 4,000 mg).   Yes Historical Provider, MD  albuterol (PROVENTIL HFA;VENTOLIN HFA) 108 (90 BASE) MCG/ACT inhaler Inhale 2 puffs into the lungs every 6 (six) hours as needed for wheezing.   Yes Historical Provider, MD  atorvastatin (LIPITOR) 20 MG tablet Take 20 mg by mouth daily.   Yes Historical Provider,  MD  clonazePAM (KLONOPIN) 2 MG tablet Take 2 mg by mouth every 6 (six) hours as needed for anxiety.   Yes Historical Provider, MD  fexofenadine (ALLEGRA) 180 MG tablet Take 180 mg by mouth daily.   Yes Historical Provider, MD  gabapentin  (NEURONTIN) 300 MG capsule Take 900 mg by mouth 4 (four) times daily.   Yes Historical Provider, MD  guaiFENesin (MUCINEX) 600 MG 12 hr tablet Take 1,200 mg by mouth 2 (two) times daily.   Yes Historical Provider, MD  tiotropium (SPIRIVA) 18 MCG inhalation capsule Place 18 mcg into inhaler and inhale daily.   Yes Historical Provider, MD  traZODone (DESYREL) 150 MG tablet Take 150 mg by mouth at bedtime.   Yes Historical Provider, MD  venlafaxine XR (EFFEXOR-XR) 150 MG 24 hr capsule Take 300 mg by mouth daily.   Yes Historical Provider, MD   Physical Exam: Filed Vitals:   02/04/13 1135 02/04/13 1148 02/04/13 1404 02/04/13 1543  BP:  135/67 128/75 152/70  Pulse: 79  61 61  Temp: 98.3 F (36.8 C)  98.3 F (36.8 C) 98.1 F (36.7 C)  TempSrc: Oral  Oral Oral  Resp: 20  16 18   SpO2: 95%  95% 94%     General:  Afebrile, no acute distress, complaining of lower extremities weakness; able to follow commands and speak in full sentences.  Eyes: PERRLA, no icterus, extra ocular muscles intact, no nystagmus.  ENT: Moist mucous membranes, no erythema or exudates inside her mouth, throat dentition; no drainage out of her ears or nostrils  Neck: Supple, full range of motion, no bruits  Cardiovascular: S1 and S2, no rubs or gallops.  Respiratory: Scattered rhonchi, no wheezing, good air movement bilaterally.  Abdomen: Soft, nontender, nondistended, positive bowel sounds, no guarding  Skin: No rashes or petechiae,  Musculoskeletal: No joint swelling or erythema  Psychiatric: No suicidal ideation or hallucinations; mood is currently stable and appropriate for ongoing situation.  Neurologic: Alert, awake and oriented x3, no focal deficit appreciated, muscle strength 3/5 bilaterally affecting lower extremities, patient reports numbness of her lower extremities but light touch and pinprick was WNL.  Labs on Admission:  Basic Metabolic Panel:  Recent Labs Lab 02/04/13 1215  NA 140  K 3.4*   CL 106  CO2 26  GLUCOSE 139*  BUN 9  CREATININE 0.61  CALCIUM 9.0   CBC:  Recent Labs Lab 02/04/13 1215  WBC 9.4  HGB 15.9*  HCT 45.9  MCV 99.6  PLT 215    Radiological Exams on Admission: Dg Lumbar Spine Complete  02/04/2013  *RADIOLOGY REPORT*  Clinical Data: Low back pain.  Bilateral lower extremity weakness.  LUMBAR SPINE - COMPLETE 4+ VIEW  Comparison: MRI lumbar spine 11/16/2012.  Findings: Five non-rib bearing lumbar vertebrae with anatomic alignment.  No fractures.  Disc space narrowing and endplate hypertrophic changes at L3-4 and L4-5, unchanged from the MR.  No pars defects.  Facet degenerative changes at L3-4, L4-5 and L5 S1. Visualized sacroiliac joints intact.  Aorto-iliac atherosclerosis without aneurysm.  IMPRESSION: No acute osseous abnormality.  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  Facet degenerative changes at L3-4, L4-5, and L5-S1.   Original Report Authenticated By: Hulan Saas, M.D.     Assessment/Plan 1-Leg weakness, bilateral: concerns for MS exacerbation vs spinal cord injury as she has degenerative disease on her spine and has experienced multiple falls recently; other considerations but less likely will be CVA.  -Admit to med-surg bed (inpatient  status) -solumedrol IV 1000mg  daily X 5 doses -neurology consult -MRI head (assess MS and also r/o CVA) -Lower spine MRI -PT therapy -neurology consult -will also check B12 level and TSH  2-Multiple sclerosis:with concerns for acute flare; treatment as mentioned above.  3-Chronic resp failure due to COPD (chronic obstructive pulmonary disease): continue oxygen supplementation, continue spiriva, mucinex and PRN albuterol. No wheezing or complaining of worsening in her breathing.  4-Depression: continue effexor  5-HLD (hyperlipidemia): continue lipitor.  6-Anxiety: continue klonopin  7-Insomnia: continue trazodone  8-Hypokalemia: will replete and check Mg   9-allergic 3  diabetes: Continue Claritin.   DVT: Lovenox  Neurology has been consulted. (Dr. Thad Ranger)  Code Status: full code Family Communication: no family at bedside Disposition Plan: Inpatient admission for further evaluation and treatment of LE weakness. Most likely MS exacerbation.  Time spent: >30 minutes  MADERA,CARLOS [facility] Pager [phone]  If 7PM-7AM, please contact night-coverage [email or web address] Password [facility] 02/04/2013, 5:29 PM

## 2013-02-04 NOTE — ED Notes (Signed)
Lengthy conversation with pt and her daughter re: pt's MS/physical limitations etc.  Pt lives at [facility] which is an independent living community.  The community provides light housekeeping and meals.  Pt also has a service dog named Zing, who is also limited by his advanced age.  Pt has a Zenaida Niece that can accommodate her wheelchair and has hand controls.  Pt's Zenaida Niece needs some maintenance on the door to the wheelchair lift that she is trying to get repaired.  Pt is asking for an electric wheelchair and a hospital bed and CSW notified ED RN CM re: DME and HHC services.  Pt does not qualify for Medicaid and cannot afford to privately pay for ALF.  She also does not want NHP.  Emotional support offered to pt/daughter.

## 2013-02-04 NOTE — ED Notes (Signed)
Social Work at bedside for consult.

## 2013-02-04 NOTE — ED Provider Notes (Signed)
History     CSN: [id number]  Arrival date & time 02/04/13  1133   First MD Initiated Contact with Patient 02/04/13 1136      No chief complaint on file.   (Consider location/radiation/quality/duration/timing/severity/associated sxs/prior treatment) HPI Comments: Ms. Kama presents for evaluation of progressively worsening weakness in her legs.  She reports she is now unable to walk mote than a few steps without crumbling to the ground.  From there, she reports she has had to crawl on her belly and knees.  She reports a 20 + yr hx of multiple sclerosis.  She has experienced exacerbations in the past that made walking extremely difficult.  She also has degenerative lumbar spine dx.  This was exacerbated by several recent falls.  She state the injections she has received have helped with the crippling pain.  She was a ble to walk and participate in her outpt rehab session 2 days ago but her weakness has acutely become more severe.  She denies fever, acute SOB, CP, palpitations, NVD, melena, rashes, and dysuria.  She has never een able to use a wcane or walker because of upper extremity nerve issues and weakness.  Patient is a 72 y.o. female presenting with weakness. The history is provided by the patient. No language interpreter was used.  Weakness This is a recurrent problem. The current episode started 2 days ago. The problem occurs constantly. The problem has been gradually worsening. Associated symptoms include shortness of breath (chronic). Pertinent negatives include no chest pain, no abdominal pain and no headaches. The symptoms are aggravated by walking. Nothing relieves the symptoms.    History reviewed. No pertinent past medical history.  No past surgical history on file.  No family history on file.  History  Substance Use Topics  . Smoking status: Not on file  . Smokeless tobacco: Not on file  . Alcohol Use: Not on file    OB History   Grav Para Term Preterm Abortions TAB  SAB Ect Mult Living                  Review of Systems  Constitutional: Positive for fatigue. Negative for fever, chills, diaphoresis, activity change and appetite change.  HENT: Negative for congestion, sore throat, rhinorrhea, trouble swallowing, neck pain, neck stiffness and postnasal drip.   Respiratory: Positive for cough (chronic) and shortness of breath (chronic). Negative for chest tightness and wheezing.   Cardiovascular: Negative for chest pain, palpitations and leg swelling.  Gastrointestinal: Negative for nausea, vomiting, abdominal pain and diarrhea.  Genitourinary: Negative for dysuria and flank pain.  Musculoskeletal: Positive for back pain, arthralgias and gait problem. Negative for myalgias and joint swelling.  Skin: Negative for rash and wound.  Neurological: Positive for weakness (bilateral lower extremity). Negative for dizziness, syncope, light-headedness and headaches.  Psychiatric/Behavioral: Negative for confusion.    Allergies  Review of patient's allergies indicates not on file.  Home Medications  No current outpatient prescriptions on file.  BP 135/67  Pulse 79  Temp(Src) 98.3 F (36.8 C) (Oral)  Resp 20  SpO2 95%  Physical Exam  Nursing note and vitals reviewed. Constitutional: She is oriented to person, place, and time. She appears well-developed and well-nourished. She is active.  Non-toxic appearance. She has a sickly appearance. She does not appear ill. No distress. She is not intubated. Nasal cannula in place.  HENT:  Head: Normocephalic and atraumatic.  Right Ear: External ear normal.  Left Ear: External ear normal.  Nose: Nose normal.  Mouth/Throat: Oropharynx is clear and moist. No oropharyngeal exudate.  Eyes: Conjunctivae are normal. Pupils are equal, round, and reactive to light. Right eye exhibits no discharge. Left eye exhibits no discharge. No scleral icterus.  Neck: Normal range of motion. Neck supple. No JVD present. No tracheal  deviation present.  Cardiovascular: Regular rhythm, intact distal pulses and normal pulses.   No extrasystoles are present. PMI is not displaced.  Exam reveals no gallop.   Murmur heard. Pulmonary/Chest: Effort normal. No stridor. Not tachypneic and not bradypneic. She is not intubated. No respiratory distress. She has decreased breath sounds. She has no wheezes. She has no rhonchi. She has no rales. She exhibits no tenderness.  Diminished at the bases  Abdominal: Soft. Bowel sounds are normal. She exhibits no distension and no mass. There is no tenderness. There is no rebound and no guarding.  Musculoskeletal: Normal range of motion. She exhibits tenderness (lumbar). She exhibits no edema.  Lymphadenopathy:    She has no cervical adenopathy.  Neurological: She is alert and oriented to person, place, and time. She displays no atrophy and no tremor. No cranial nerve deficit. She exhibits abnormal muscle tone (diminished strength - no focal deficits however.). GCS eye subscore is 4. GCS verbal subscore is 5. GCS motor subscore is 6. She displays no Babinski's sign on the right side. She displays no Babinski's sign on the left side.  Pt is able to raise both legs against gravity and mild resistance from the supine position.  Skin: Skin is warm and dry. No rash noted. No erythema. No pallor.  Psychiatric: She has a normal mood and affect. Her behavior is normal.    ED Course  Procedures (including critical care time)  Labs Reviewed - No data to display No results found.   No diagnosis found.    Date: 02/04/2013 @ 1147  Rate: 78 bpm  Rhythm: sinus  QRS Axis: normal  Intervals: normal  ST/T Wave abnormalities: nonspecific ST changes  Conduction Disutrbances:none  Narrative Interpretation:   Old EKG Reviewed: unchanged     MDM  Pt presents for evaluation of progressively worsening weakness.  She has had several recent falls and within the last 48 hours is unable to walk without her  legs "giving out".  She lives in an independent living apartment and reports significant difficulty navigating her home.  She appears nontoxic, note stable VS, NAD.  She has a hx of multiple sclerosis, COPD, degenerative arthritis, and lung cancer.  Will perform a basic screening evaluation including labs and a urinalysis to assess for any possible reason for an acute MS flare-up.  Will also consult social services as she describes a condition that is not appropriate for independent living.  1600.  Pt is unable to ambulate without significant.  Discussed her evaluation with Case Mgmnt and initially thought she might be able to return home with home services (skilled nursing/PT/OT).  She is unable to move more than 3 feet without significant help.  This is an acute change over the last 48 hours.  Discussed her evaluation with the [facility] hospitalist.  Plan admit for further mgmnt.        Tobin Chad, MD 02/04/13 [phone]

## 2013-02-05 LAB — BASIC METABOLIC PANEL
BUN: 10 mg/dL (ref 6–23)
Chloride: 104 mEq/L (ref 96–112)
Creatinine, Ser: 0.57 mg/dL (ref 0.50–1.10)
Glucose, Bld: 146 mg/dL — ABNORMAL HIGH (ref 70–99)
Potassium: 4.1 mEq/L (ref 3.5–5.1)

## 2013-02-05 LAB — CBC
HCT: 47.5 % — ABNORMAL HIGH (ref 36.0–46.0)
Hemoglobin: 16.1 g/dL — ABNORMAL HIGH (ref 12.0–15.0)
MCHC: 33.9 g/dL (ref 30.0–36.0)
MCV: 99.6 fL (ref 78.0–100.0)
WBC: 10.1 10*3/uL (ref 4.0–10.5)

## 2013-02-05 LAB — VITAMIN B12: Vitamin B-12: 356 pg/mL (ref 211–911)

## 2013-02-05 LAB — GLUCOSE, CAPILLARY: Glucose-Capillary: 152 mg/dL — ABNORMAL HIGH (ref 70–99)

## 2013-02-05 MED ORDER — SODIUM CHLORIDE 0.9 % IV SOLN: 1000.0000 mg | Freq: Every day | INTRAVENOUS | Status: AC

## 2013-02-05 MED ORDER — OXYCODONE HCL 5 MG PO TABS: 5.0000 mg | ORAL_TABLET | ORAL | Status: DC | PRN

## 2013-02-05 NOTE — Discharge Summary (Signed)
Physician Discharge Summary  [name] MWU:[id number] DOB: [birth year] DOA: 02/04/2013  PCP: Ginette Otto, MD  Admit date: 02/04/2013 Discharge date: 02/05/2013  Recommendations for Outpatient Follow-up:  1. Follow up with neurology per scheduled appointment  Discharge Diagnoses:  Principal Problem:   Leg weakness, bilateral Active Problems:   Multiple sclerosis   COPD (chronic obstructive pulmonary disease)   Depression   HLD (hyperlipidemia)   Anxiety   Insomnia   Hypokalemia  Discharge Condition: medically stable to go home today; HHPT/OT/RN and SW order in place; [facility] aide also ordered. Patient will go home and is to receive 4 more doses daily of 1 gm solumedrol  Diet recommendation: as tolerated  History of present illness:  72 y.o. female with PMHx of MS diagnosed 23 years ago an dlast flare 2 years ago who presented to [facility] ED 02/04/13 with lower and upper extremity weakness. She usually uses walker to ambulate but she could not do that this time. She had no other symptoms on admission. MRI brain concerning for an acute flare and neurology was consulted and recommended 1 gm solumedrol for 5 days.  Assessment and Plan:  Principal Problem:   Leg weakness, bilateral  Likely MS flare  Patient declined SNF placement; HHPT and OT placed; HH RN needed for solumedrol infusion for next 4 days on discharge.  Appreciate  neurology following Active Problems:   Multiple sclerosis  Manage as above   COPD (chronic obstructive pulmonary disease)  Stable Hypokalemia  repleted   Procedures:  None   Consultations:  Neurology (Dr. Thad Ranger)  Discharge Exam: Filed Vitals:   02/05/13 0816  BP: 133/63  Pulse: 72  Temp: 98.4 F (36.9 C)  Resp: 20   Filed Vitals:   02/04/13 1900 02/04/13 2152 02/05/13 0544 02/05/13 0816  BP: 140/64 138/56 127/54 133/63  Pulse: 72 63 68 72  Temp: 97.7 F (36.5 C) 98.2 F (36.8 C) 98 F (36.7 C) 98.4 F (36.9 C)  TempSrc:  Oral Oral Oral Oral  Resp: 18 17 19 20   Height: 5' 3.5" (1.613 m)     Weight: 65.5 kg (144 lb 6.4 oz)     SpO2: 98% 93% 93% 98%    General: Pt is alert, follows commands appropriately, not in acute distress Cardiovascular: Regular rate and rhythm, S1/S2 +, no murmurs, no rubs, no gallops Respiratory: Clear to auscultation bilaterally, no wheezing, no crackles, no rhonchi Abdominal: Soft, non tender, non distended, bowel sounds +, no guarding Extremities: no edema, no cyanosis, pulses palpable bilaterally DP and PT Neuro: LE weakness  Discharge Instructions  Discharge Orders   Future Orders Complete By Expires     Call MD for:  difficulty breathing, headache or visual disturbances  As directed     Call MD for:  persistant dizziness or light-headedness  As directed     Call MD for:  persistant nausea and vomiting  As directed     Call MD for:  severe uncontrolled pain  As directed     Diet - low sodium heart healthy  As directed     Discharge instructions  As directed     Comments:      You will receive solumedrol 1 gm IV daily for 4 more doses on discharge (from 3/15 - including 3/18)    Increase activity slowly  As directed         Medication List    TAKE these medications       acetaminophen 500 MG tablet  Commonly  known as:  TYLENOL  Take 1,500 mg by mouth as needed for pain (up to 4,000 mg).     albuterol 108 (90 BASE) MCG/ACT inhaler  Commonly known as:  PROVENTIL HFA;VENTOLIN HFA  Inhale 2 puffs into the lungs every 6 (six) hours as needed for wheezing.     atorvastatin 20 MG tablet  Commonly known as:  LIPITOR  Take 20 mg by mouth daily.     clonazePAM 2 MG tablet  Commonly known as:  KLONOPIN  Take 2 mg by mouth every 6 (six) hours as needed for anxiety.     fexofenadine 180 MG tablet  Commonly known as:  ALLEGRA  Take 180 mg by mouth daily.     gabapentin 300 MG capsule  Commonly known as:  NEURONTIN  Take 900 mg by mouth 4 (four) times daily.      guaiFENesin 600 MG 12 hr tablet  Commonly known as:  MUCINEX  Take 1,200 mg by mouth 2 (two) times daily.     oxyCODONE 5 MG immediate release tablet  Commonly known as:  Oxy IR/ROXICODONE  Take 1 tablet (5 mg total) by mouth every 4 (four) hours as needed.     sodium chloride 0.9 % SOLN 50 mL with methylPREDNISolone sodium succinate 1000 MG SOLR 1,000 mg  Inject 1,000 mg into the vein daily.  Start taking on:  02/06/2013     tiotropium 18 MCG inhalation capsule  Commonly known as:  SPIRIVA  Place 18 mcg into inhaler and inhale daily.     traZODone 150 MG tablet  Commonly known as:  DESYREL  Take 150 mg by mouth at bedtime.     venlafaxine XR 150 MG 24 hr capsule  Commonly known as:  EFFEXOR-XR  Take 300 mg by mouth daily.           Follow-up Information   Follow up with Ginette Otto, MD In 1 week.   Contact information:   [address] [place] [address] [phone]        The results of significant diagnostics from this hospitalization (including imaging, microbiology, ancillary and laboratory) are listed below for reference.    Significant Diagnostic Studies: Dg Lumbar Spine Complete  02/04/2013  *RADIOLOGY REPORT*  Clinical Data: Low back pain.  Bilateral lower extremity weakness.  LUMBAR SPINE - COMPLETE 4+ VIEW  Comparison: MRI lumbar spine 11/16/2012.  Findings: Five non-rib bearing lumbar vertebrae with anatomic alignment.  No fractures.  Disc space narrowing and endplate hypertrophic changes at L3-4 and L4-5, unchanged from the MR.  No pars defects.  Facet degenerative changes at L3-4, L4-5 and L5 S1. Visualized sacroiliac joints intact.  Aorto-iliac atherosclerosis without aneurysm.  IMPRESSION: No acute osseous abnormality.  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  Facet degenerative changes at L3-4, L4-5, and L5-S1.   Original Report Authenticated By: Hulan Saas, M.D.    Mr Laqueta Jean Wo Contrast  02/05/2013   *RADIOLOGY REPORT*  Clinical Data: Bilateral lower extremity weakness.  Multiple sclerosis.  MRI HEAD WITHOUT AND WITH CONTRAST  Technique:  Multiplanar, multiecho pulse sequences of the brain and surrounding structures were obtained according to standard protocol without and with intravenous contrast  Contrast: 13mL MULTIHANCE GADOBENATE DIMEGLUMINE 529 MG/ML IV SOLN  Comparison: CT head without contrast 03/26/2011  Findings: The diffusion weighted images demonstrate no evidence for acute or subacute infarction.  Moderate periventricular and subcortical white matter changes are present bilaterally.  These are greater than  expected for age.  The configuration is compatible with a demyelinating process, demonstrating significant involvement of the callosal septal margin, more prominently left than right.  No hemorrhage or mass lesion is present.  The ventricles are of normal size.  No significant extra-axial fluid collection is present.  Flow is present in the major intracranial arteries.  The globes and orbits are intact.  The paranasal sinuses and mastoid air cells are clear.  IMPRESSION:  1.  Moderate white matter changes as described. The finding is nonspecific but can be seen in the setting of chronic microvascular ischemia, a demyelinating process such as multiple sclerosis, vasculitis, complicated migraine headaches, or as the sequelae of a prior infectious or inflammatory process.  The nonspecific, the pattern of callosal septal involvement is typical of a pneumonia process such as multiple sclerosis.  There is no enhancement or restricted diffusion to suggest acute demyelination. 2.  No acute intracranial abnormality.   Original Report Authenticated By: Marin Roberts, M.D.    Mr Lumbar Spine Wo Contrast  02/04/2013  *RADIOLOGY REPORT*  Clinical Data: Multiple sclerosis.  Worsening leg weakness with secondary falls.  MRI LUMBAR SPINE WITHOUT CONTRAST  Technique:  Multiplanar and multiecho pulse  sequences of the lumbar spine were obtained without intravenous contrast.  Comparison: MRI lumbar spine 11/16/2012.  Findings: Normal signal is present in the lower thoracic spinal cord to the conus medullaris which terminates at L1.  Chronic end plate marrow changes are again noted at the L3-4, L4-5, and L5-S1. Leftward curvature of the lumbar spine centered at L3.  Slight anterolisthesis at L4-5 is stable.  Vertebral body heights and alignment are otherwise stable.  Limited imaging of the abdomen is unremarkable.  The disc levels at L1-2 and above are normal.  L2-3:  A broad-based disc herniation is asymmetric to the left. There is no significant stenosis.  L3-4:  A broad-based disc herniation is most evident on the right. Moderate right and mild left lateral recess narrowing is stable. Moderate to severe right foraminal stenosis has progressed. Moderate facet hypertrophy contributes.  L4-5:  A broad-based disc herniation is present.  Moderate facet hypertrophy is present.  Mild to moderate lateral recess narrowing is worse on the left.  Severe left and moderate right foraminal stenosis is similar to the prior exam.  L5-S1:  Mild facet hypertrophy is worse on the right.  A mild broad- based disc bulge is evident.  There is no significant stenosis.  IMPRESSION:  1.  Stable scoliosis and chronic end plate degenerative change. 2.  Slight progression of moderate to severe right foraminal stenosis at L3-4. 3.  Moderate right to a mild left lateral recess narrowing at L3-4. 4.  Mild to moderate lateral recess narrowing at L4-5 is worse on the left. 5.  Severe left and moderate right foraminal stenosis at L4-5. 6.  Mild facet hypertrophy at L5-S1 is worse on the right.  There is no significant stenosis.   Original Report Authenticated By: Marin Roberts, M.D.     Microbiology: No results found for this or any previous visit (from the past 240 hour(s)).   Labs: Basic Metabolic Panel:  Recent Labs Lab  02/04/13 1215 02/04/13 2158 02/05/13 0644  NA 140  --  139  K 3.4*  --  4.1  CL 106  --  104  CO2 26  --  23  GLUCOSE 139*  --  146*  BUN 9  --  10  CREATININE 0.61 0.53 0.57  CALCIUM 9.0  --  9.1  MG  --  1.9  --   PHOS  --  3.2  --    Liver Function Tests:  Recent Labs Lab 02/04/13 2158  AST 19  ALT 15  ALKPHOS 119*  BILITOT 0.2*  PROT 6.4  ALBUMIN 3.4*   No results found for this basename: LIPASE, AMYLASE,  in the last 168 hours No results found for this basename: AMMONIA,  in the last 168 hours CBC:  Recent Labs Lab 02/04/13 1215 02/04/13 2158 02/05/13 0644  WBC 9.4 12.9* 10.1  HGB 15.9* 16.5* 16.1*  HCT 45.9 48.1* 47.5*  MCV 99.6 101.7* 99.6  PLT 215 220 221   Cardiac Enzymes: No results found for this basename: CKTOTAL, CKMB, CKMBINDEX, TROPONINI,  in the last 168 hours BNP: BNP (last 3 results) No results found for this basename: PROBNP,  in the last 8760 hours CBG:  Recent Labs Lab 02/04/13 1935 02/04/13 2209 02/05/13 0813  GLUCAP 131* 173* 152*    Time coordinating discharge: Over 30 minutes  Signed:  Manson Passey, MD  [facility]  02/05/2013, 10:24 AM  Pager #: [phone]

## 2013-02-05 NOTE — Progress Notes (Signed)
NEURO HOSPITALIST PROGRESS NOTE   SUBJECTIVE:                                                                                                                         No complaints, still feels weak on the left leg.  Now recieved two doses Solumedrol.  OBJECTIVE:                                                                                                                           Vital signs in last 24 hours: Temp:  [97.7 F (36.5 C)-98.4 F (36.9 C)] 98.4 F (36.9 C) (03/14 0816) Pulse Rate:  [61-79] 72 (03/14 0816) Resp:  [16-20] 20 (03/14 0816) BP: (127-152)/(54-75) 133/63 mmHg (03/14 0816) SpO2:  [93 %-98 %] 98 % (03/14 0816) Weight:  [65.5 kg (144 lb 6.4 oz)] 65.5 kg (144 lb 6.4 oz) (03/13 1900)  Intake/Output from previous day: 03/13 0701 - 03/14 0700 In: -  Out: 1 [Urine:1] Intake/Output this shift:   Nutritional status: General  Past Medical History  Diagnosis Date  . Multiple sclerosis   . COPD (chronic obstructive pulmonary disease)   . Cancer      Neurologic Exam:  Mental Status: Alert, oriented, thought content appropriate.  Speech fluent without evidence of aphasia.  Able to follow 3 step commands without difficulty. Cranial Nerves: II: Visual fields grossly normal, pupils equal, round, reactive to light and accommodation III,IV, VI: ptosis not present, extra-ocular motions intact bilaterally V,VII: smile symmetric, facial light touch sensation normal bilaterally VIII: hearing normal bilaterally IX,X: gag reflex present XI: bilateral shoulder shrug XII: midline tongue extension Motor: Right : Upper extremity   4/5    Left:     Upper extremity   4/5  Lower extremity   4/5     Lower extremity   4/5 --with LE strength testing she would give initial resistance then allow her leg to drop to the bed with poor effort. Initial resistance was a strong 4/5 Tone and bulk:normal tone throughout; no atrophy noted Sensory:  Pinprick and light touch intact throughout, bilaterally Deep Tendon Reflexes: 2+ and symmetric throughout Plantars: Mute bilaterally Cerebellar: normal finger-to-nose,  normal heel-to-shin test CV: pulses palpable throughout    Lab  Results: No results found for this basename: cbc, bmp, coags, chol, tri, ldl, hga1c   Lipid Panel No results found for this basename: CHOL, TRIG, HDL, CHOLHDL, VLDL, LDLCALC,  in the last 72 hours  Studies/Results: Dg Lumbar Spine Complete  02/04/2013  *RADIOLOGY REPORT*  Clinical Data: Low back pain.  Bilateral lower extremity weakness.  LUMBAR SPINE - COMPLETE 4+ VIEW  Comparison: MRI lumbar spine 11/16/2012.  Findings: Five non-rib bearing lumbar vertebrae with anatomic alignment.  No fractures.  Disc space narrowing and endplate hypertrophic changes at L3-4 and L4-5, unchanged from the MR.  No pars defects.  Facet degenerative changes at L3-4, L4-5 and L5 S1. Visualized sacroiliac joints intact.  Aorto-iliac atherosclerosis without aneurysm.  IMPRESSION: No acute osseous abnormality.  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  Facet degenerative changes at L3-4, L4-5, and L5-S1.   Original Report Authenticated By: Hulan Saas, M.D.    Mr Laqueta Jean Wo Contrast  02/05/2013  *RADIOLOGY REPORT*  Clinical Data: Bilateral lower extremity weakness.  Multiple sclerosis.  MRI HEAD WITHOUT AND WITH CONTRAST  Technique:  Multiplanar, multiecho pulse sequences of the brain and surrounding structures were obtained according to standard protocol without and with intravenous contrast  Contrast: 13mL MULTIHANCE GADOBENATE DIMEGLUMINE 529 MG/ML IV SOLN  Comparison: CT head without contrast 03/26/2011  Findings: The diffusion weighted images demonstrate no evidence for acute or subacute infarction.  Moderate periventricular and subcortical white matter changes are present bilaterally.  These are greater than expected for age.  The configuration is compatible with  a demyelinating process, demonstrating significant involvement of the callosal septal margin, more prominently left than right.  No hemorrhage or mass lesion is present.  The ventricles are of normal size.  No significant extra-axial fluid collection is present.  Flow is present in the major intracranial arteries.  The globes and orbits are intact.  The paranasal sinuses and mastoid air cells are clear.  IMPRESSION:  1.  Moderate white matter changes as described. The finding is nonspecific but can be seen in the setting of chronic microvascular ischemia, a demyelinating process such as multiple sclerosis, vasculitis, complicated migraine headaches, or as the sequelae of a prior infectious or inflammatory process.  The nonspecific, the pattern of callosal septal involvement is typical of a pneumonia process such as multiple sclerosis.  There is no enhancement or restricted diffusion to suggest acute demyelination. 2.  No acute intracranial abnormality.   Original Report Authenticated By: Marin Roberts, M.D.    Mr Lumbar Spine Wo Contrast  02/04/2013  *RADIOLOGY REPORT*  Clinical Data: Multiple sclerosis.  Worsening leg weakness with secondary falls.  MRI LUMBAR SPINE WITHOUT CONTRAST  Technique:  Multiplanar and multiecho pulse sequences of the lumbar spine were obtained without intravenous contrast.  Comparison: MRI lumbar spine 11/16/2012.  Findings: Normal signal is present in the lower thoracic spinal cord to the conus medullaris which terminates at L1.  Chronic end plate marrow changes are again noted at the L3-4, L4-5, and L5-S1. Leftward curvature of the lumbar spine centered at L3.  Slight anterolisthesis at L4-5 is stable.  Vertebral body heights and alignment are otherwise stable.  Limited imaging of the abdomen is unremarkable.  The disc levels at L1-2 and above are normal.  L2-3:  A broad-based disc herniation is asymmetric to the left. There is no significant stenosis.  L3-4:  A broad-based  disc herniation is most evident on the right. Moderate right and mild left lateral recess narrowing is stable.  Moderate to severe right foraminal stenosis has progressed. Moderate facet hypertrophy contributes.  L4-5:  A broad-based disc herniation is present.  Moderate facet hypertrophy is present.  Mild to moderate lateral recess narrowing is worse on the left.  Severe left and moderate right foraminal stenosis is similar to the prior exam.  L5-S1:  Mild facet hypertrophy is worse on the right.  A mild broad- based disc bulge is evident.  There is no significant stenosis.  IMPRESSION:  1.  Stable scoliosis and chronic end plate degenerative change. 2.  Slight progression of moderate to severe right foraminal stenosis at L3-4. 3.  Moderate right to a mild left lateral recess narrowing at L3-4. 4.  Mild to moderate lateral recess narrowing at L4-5 is worse on the left. 5.  Severe left and moderate right foraminal stenosis at L4-5. 6.  Mild facet hypertrophy at L5-S1 is worse on the right.  There is no significant stenosis.   Original Report Authenticated By: Marin Roberts, M.D.     MEDICATIONS                                                                                                                        Scheduled: . atorvastatin  20 mg Oral Daily  . enoxaparin (LOVENOX) injection  40 mg Subcutaneous Q24H  . gabapentin  900 mg Oral QID  . guaiFENesin  1,200 mg Oral BID  . insulin aspart  0-15 Units Subcutaneous TID WC  . insulin aspart  0-5 Units Subcutaneous QHS  . loratadine  10 mg Oral Daily  . methylPREDNISolone (SOLU-MEDROL) injection  500 mg Intravenous Q12H  . tiotropium  18 mcg Inhalation Daily  . traZODone  150 mg Oral QHS  . venlafaxine XR  300 mg Oral Daily    ASSESSMENT/PLAN:                                                                                                            72 YO female with Dx MS followed by Dr. Lin Givens.  MRI of brain shows prior lesions but no  enhancing active lesions. Imaging of lumbar spine shows no compression. Although no active lesions noted would continue with Solumedrol 1000 mg daily for total of 5 doses.   Recommend: 1) Continue PT/OT 2) Solumedrol 1G daily for 5 doses (she has received 2 doses) 3) Will need to follow up with out patient neurologist Lin Givens) after discharge.    Assessment and plan discussed with with attending physician and they are in agreement.    Onalee Hua  [facility] [phone]  02/05/2013, 9:09 AM

## 2013-02-05 NOTE — Care Management Note (Unsigned)
    Page 1 of 2   02/05/2013     10:52:37 AM   CARE MANAGEMENT NOTE 02/05/2013  Patient:  VALLEY, KE   Account Number:  [id number]  Date Initiated:  02/05/2013  Documentation initiated by:  Fransico Michael  Subjective/Objective Assessment:   admitted yesterday with c/o increasing leg weakness. Active history of MS noted.     Action/Plan:   Prior to admission, patient lived at [place] (independent living)   Anticipated DC Date:  02/05/2013   Anticipated DC Plan:  HOME W HOME HEALTH SERVICES      DC Planning Services  CM consult      [facility]  HOME HEALTH   Choice offered to / List presented to:  C-1 Patient        HH arranged  HH-1 RN  HH-2 PT  HH-3 OT  HH-4 NURSE'S AIDE  HH-6 SOCIAL WORKER      HH agency  [facility]   Status of service:  Completed, signed off Medicare Important Message given?   (If response is "NO", the following Medicare IM given date fields will be blank) Date Medicare IM given:   Date Additional Medicare IM given:    Discharge Disposition:    Per UR Regulation:  Reviewed for med. necessity/level of care/duration of stay  If discussed at Long Length of Stay Meetings, dates discussed:    Comments:  02/05/13-1033-J.Minnich,RN,BSN [phone]      Spoke with Peggy,RN on 5100. Informed her of conversations that had occurred this morning regarding patient's disposition. Also requested per [facility] that patient be discharged home with NS lock in place for administration of IV solumedrol. Voiced understanding. No further needs identified.  02/05/13-1030-J.Minnich,RN,BSN [phone]      Spoke with Ed at [facility] regarding patient's discharge. Notified Ed that patient would be ready to leave and at patient pick up entrance C at 1230.  02/05/13-1028-J.Minnich,RN,BSN [phone]     Notified Dr. Elisabeth Pigeon that [facility] is to provide care and agreeable to RN administration of solumedrol x 4 days. Also notified Dr. Elisabeth Pigeon of need  for patient to be ready to leave hospital by 1230. Voiced agreement.  02/05/13-1022-J.Minnich,RN,BSN [phone]     Spoke with Lupita Leash, RN with [facility] inquiring about availability for RN to administer solumedrol for next 4 days. Voiced that it would be fine. Notified that patient will be discharged today.  02/05/13-1019-J.Minnich,RN,BSN [phone]     Received call from Dr. Elisabeth Pigeon regarding need to discharge patient on 1 gram of IV solumedrol per day x 4 days after dishcarge.  02/05/13-1005-J.Minnich,RN,BSN [phone]      Spoke with Arline Asp at [facility] [phone] regarding transportation for patient if discharged today. States, "Our bus can get her today if she is ready to leave by 1pm. Our bus is not available at all tomorrow."  02/05/13-1003-J.Minnich,RN,BSN [phone]      Spoke with Peggy,RN regarding potential discharge today. Informed RN that [facility]. CM will check on arrangements from [place].

## 2013-02-05 NOTE — Progress Notes (Signed)
Patient discharged to [facility] in stable condition.  D/C instruction reviewed with patient and all questions answered.  Patient left unit via w/c with volunteer services to be picked up by [employer].  Baird Lyons 12:44 PM

## 2013-02-09 ENCOUNTER — Encounter: Payer: Self-pay | Admitting: Physical Therapy

## 2013-02-11 ENCOUNTER — Ambulatory Visit: Payer: Medicare Other | Admitting: Physical Therapy

## 2013-02-16 ENCOUNTER — Encounter: Payer: Self-pay | Admitting: Physical Therapy

## 2013-02-18 ENCOUNTER — Encounter: Payer: Self-pay | Admitting: Physical Therapy

## 2013-02-23 ENCOUNTER — Encounter: Payer: Medicare Other | Admitting: Physical Therapy

## 2013-02-25 ENCOUNTER — Encounter: Payer: Medicare Other | Admitting: Physical Therapy

## 2013-03-02 ENCOUNTER — Encounter: Payer: Medicare Other | Admitting: Physical Therapy

## 2013-03-04 ENCOUNTER — Ambulatory Visit: Payer: Medicare Other | Admitting: Physical Therapy

## 2013-04-08 ENCOUNTER — Ambulatory Visit: Payer: Medicare Other | Admitting: Rehabilitative and Restorative Service Providers"

## 2013-04-13 ENCOUNTER — Ambulatory Visit: Payer: Medicare Other | Attending: Orthopaedic Surgery

## 2013-04-13 DIAGNOSIS — R5381 Other malaise: Secondary | ICD-10-CM | POA: Insufficient documentation

## 2013-04-13 DIAGNOSIS — R262 Difficulty in walking, not elsewhere classified: Secondary | ICD-10-CM | POA: Insufficient documentation

## 2013-04-13 DIAGNOSIS — IMO0001 Reserved for inherently not codable concepts without codable children: Secondary | ICD-10-CM | POA: Insufficient documentation

## 2013-04-13 DIAGNOSIS — M545 Low back pain, unspecified: Secondary | ICD-10-CM | POA: Insufficient documentation

## 2013-04-15 ENCOUNTER — Ambulatory Visit: Payer: Medicare Other | Admitting: Physical Therapy

## 2013-04-27 ENCOUNTER — Ambulatory Visit: Payer: Medicare Other | Admitting: Physical Therapy

## 2013-04-27 ENCOUNTER — Ambulatory Visit: Payer: Medicare Other | Attending: Orthopaedic Surgery | Admitting: Physical Therapy

## 2013-04-27 DIAGNOSIS — R262 Difficulty in walking, not elsewhere classified: Secondary | ICD-10-CM | POA: Insufficient documentation

## 2013-04-27 DIAGNOSIS — IMO0001 Reserved for inherently not codable concepts without codable children: Secondary | ICD-10-CM | POA: Insufficient documentation

## 2013-04-27 DIAGNOSIS — M545 Low back pain, unspecified: Secondary | ICD-10-CM | POA: Insufficient documentation

## 2013-04-27 DIAGNOSIS — R5381 Other malaise: Secondary | ICD-10-CM | POA: Insufficient documentation

## 2013-04-29 ENCOUNTER — Ambulatory Visit: Payer: Medicare Other | Admitting: Physical Therapy

## 2013-05-04 ENCOUNTER — Ambulatory Visit: Payer: Medicare Other | Admitting: Physical Therapy

## 2013-05-06 ENCOUNTER — Ambulatory Visit: Payer: Medicare Other | Admitting: Physical Therapy

## 2013-05-11 ENCOUNTER — Ambulatory Visit: Payer: Medicare Other | Admitting: Physical Therapy

## 2013-05-13 ENCOUNTER — Ambulatory Visit: Payer: Medicare Other

## 2013-05-18 ENCOUNTER — Ambulatory Visit: Payer: Medicare Other | Admitting: Physical Therapy

## 2013-05-20 ENCOUNTER — Ambulatory Visit: Payer: Medicare Other

## 2013-05-25 ENCOUNTER — Ambulatory Visit: Payer: Medicare Other | Attending: Orthopaedic Surgery | Admitting: Physical Therapy

## 2013-05-25 DIAGNOSIS — IMO0001 Reserved for inherently not codable concepts without codable children: Secondary | ICD-10-CM | POA: Insufficient documentation

## 2013-05-25 DIAGNOSIS — M545 Low back pain, unspecified: Secondary | ICD-10-CM | POA: Insufficient documentation

## 2013-05-25 DIAGNOSIS — R262 Difficulty in walking, not elsewhere classified: Secondary | ICD-10-CM | POA: Insufficient documentation

## 2013-05-25 DIAGNOSIS — R5381 Other malaise: Secondary | ICD-10-CM | POA: Insufficient documentation

## 2013-05-27 ENCOUNTER — Ambulatory Visit (INDEPENDENT_AMBULATORY_CARE_PROVIDER_SITE_OTHER): Payer: Medicare Other | Admitting: Neurology

## 2013-05-27 ENCOUNTER — Encounter: Payer: Self-pay | Admitting: Neurology

## 2013-05-27 ENCOUNTER — Ambulatory Visit: Payer: Medicare Other | Admitting: Physical Therapy

## 2013-05-27 VITALS — BP 129/77 | HR 85 | Ht 64.0 in | Wt 143.0 lb

## 2013-05-27 DIAGNOSIS — R413 Other amnesia: Secondary | ICD-10-CM

## 2013-05-27 DIAGNOSIS — R269 Unspecified abnormalities of gait and mobility: Secondary | ICD-10-CM | POA: Insufficient documentation

## 2013-05-27 DIAGNOSIS — G35 Multiple sclerosis: Secondary | ICD-10-CM

## 2013-05-27 HISTORY — DX: Unspecified abnormalities of gait and mobility: R26.9

## 2013-05-27 HISTORY — DX: Other amnesia: R41.3

## 2013-05-27 NOTE — Progress Notes (Signed)
Reason for visit: Multiple sclerosis  [name] is a 72 y.o. female  History of present illness:  [name] is a 43 year old right-handed white female with a history of multiple sclerosis that initially was diagnosed at age 40. The patient presented initially with pain that was intermittent that would go down the spine. The patient eventually was seen by Dr. Lin Bush, and she was placed on Betaseron for number of years. The patient was taken off of Betaseron in 2009 as it was felt that she had a chronic progressive course of multiple sclerosis. He was felt that the Betaseron offered little benefit at that time. The patient indicates that she has had some progression of her ability to ambulate, but then she goes on to say that for the first 5 years after the diagnosis of MS, she was in a power wheelchair, completely unable to ambulate. The patient indicates that she has had some problems with memory that has been progressive over time. The patient walks with a walker, and she currently is involved with physical therapy. The patient indicates that Dr. Lin Bush has moved to the [place], [place] area, and she is unable to drive to see him. The patient wishes to make contact with another neurologist. One of the main reasons for this visit also is to have a mobility assessment or a power wheelchair. The patient does report some problems with bladder control, and she notes symptoms consistent with irritable bowel syndrome with alternating constipation and diarrhea. The patient reports monocular double vision involving the left eye. The patient has numbness in the hands, and some decreased hearing. The patient has occasional episodes of pain down the right arm, and numbness in the feet. She had what was thought to be an exacerbation of her multiple sclerosis in March of 2014 with weakness in the legs. The patient indicates that she gained little benefit with the Solu-Medrol. The patient is living in  an independent living situation. The patient is sent to this office for further evaluation. MRI the brain done in March of 2014 was reviewed on line, and it does show features very typical for multiple sclerosis, with evidence of lesions within the spinal cord as well.  Past Medical History  Diagnosis Date  . Multiple sclerosis   . COPD (chronic obstructive pulmonary disease)   . Cancer   . Scoliosis   . Depression   . High cholesterol   . Abnormality of gait 05/27/2013  . Memory deficit 05/27/2013  . Hearing deficit   . Diplopia     Monocular, OS    Past Surgical History  Procedure Laterality Date  . Abdominal hysterectomy    . Ovarian cyst removal  2008  . Cholecystectomy  2006  . Cesarean section  1980  . Appendectomy  1952    Family History  Problem Relation Age of Onset  . Stroke Mother   . Heart disease Mother   . Breast cancer Mother   . Pancreatitis Mother   . Leukemia Father   . Heart disease Father   . Pancreatitis Sister   . Heart disease Brother   . Heart disease Maternal Uncle   . Diverticulitis Paternal Aunt     Social history:  reports that she has been smoking.  She does not have any smokeless tobacco history on file. She reports that she does not drink alcohol or use illicit drugs.  Medications:  Current Outpatient Prescriptions on File Prior to Visit  Medication Sig Dispense Refill  .  acetaminophen (TYLENOL) 500 MG tablet Take 1,500 mg by mouth as needed for pain (up to 4,000 mg).      Marland Kitchen albuterol (PROVENTIL HFA;VENTOLIN HFA) 108 (90 BASE) MCG/ACT inhaler Inhale 2 puffs into the lungs every 6 (six) hours as needed for wheezing.      Marland Kitchen atorvastatin (LIPITOR) 20 MG tablet Take 20 mg by mouth daily.      . clonazePAM (KLONOPIN) 2 MG tablet Take 2 mg by mouth daily.       . fexofenadine (ALLEGRA) 180 MG tablet Take 180 mg by mouth daily.      Marland Kitchen gabapentin (NEURONTIN) 300 MG capsule Take 900 mg by mouth 4 (four) times daily.      Marland Kitchen guaiFENesin (MUCINEX) 600  MG 12 hr tablet Take 1,200 mg by mouth 2 (two) times daily.      Marland Kitchen tiotropium (SPIRIVA) 18 MCG inhalation capsule Place 18 mcg into inhaler and inhale daily.      . traZODone (DESYREL) 150 MG tablet Take 150 mg by mouth at bedtime.      Marland Kitchen venlafaxine XR (EFFEXOR-XR) 150 MG 24 hr capsule Take 300 mg by mouth daily.       No current facility-administered medications on file prior to visit.    Allergies:  Allergies  Allergen Reactions  . Dilantin (Phenytoin) Hives  . Lactose Intolerance (Gi) Diarrhea  . Sulfa Antibiotics Hives    ROS:  Out of a complete 14 system review of symptoms, the patient complains only of the following symptoms, and all other reviewed systems are negative.  Weight gain, fatigue Heart murmur Hearing loss, difficulty swallowing Moles Double vision Shortness of breath, wheezing Diarrhea and constipation alternating Urination problems Easy bruising Muscle cramps Allergies Memory loss, numbness, weakness, slurred speech, difficulty swallowing, dizziness, tremor Depression Sleepiness, restless legs  Blood pressure 129/77, pulse 85, height 5\' 4"  (1.626 m), weight 143 lb (64.864 kg).  Physical Exam  General: The patient is alert and cooperative at the time of the examination.  Head: Pupils are equal, round, and reactive to light. Discs are flat bilaterally. Cataracts are present bilaterally.  Neck: The neck is supple, no carotid bruits are noted.  Respiratory: The respiratory examination is clear.  Cardiovascular: The cardiovascular examination reveals a regular rate and rhythm, no obvious murmurs or rubs are noted.  Skin: Extremities are without significant edema.  Neurologic Exam  Mental status: Mini-Mental status examination done today shows a total score of 28/30. Patient is able to name 16 animals in 60 seconds.  Cranial nerves: Facial symmetry is present. There is good sensation of the face to pinprick and soft touch bilaterally, with the  exception that there is a decrease in pinprick sensation on the left forehead. The patient does not split midline with vibration sensation on the forehead.. The strength of the facial muscles and the muscles to head turning and shoulder shrug are normal bilaterally. Speech is well enunciated, no aphasia or dysarthria is noted, but the speech is somewhat dysphonic. Extraocular movements are full. Visual fields are full.  Motor: The motor testing reveals 5 over 5 strength of all 4 extremities. Good symmetric motor tone is noted throughout.  Sensory: Sensory testing is intact to pinprick, soft touch, vibration sensation, and position sense on all 4 extremities, with the exception that pinprick sensation is decreased on the right arm, and the left leg. No evidence of extinction is noted.  Coordination: Cerebellar testing reveals good finger-nose-finger bilaterally. Heel-to-shin is slightly ataxic bilaterally.  Gait and  station: Gait is slightly wide-based, unsteady. The patient is able to ambulate independently, but she usually uses a walker for ambulation. The patient is able to perform tandem gait, but this is quite unsteady. Romberg is negative. No drift is seen.  Reflexes: Deep tendon reflexes are symmetric, but the reflexes in the lower extremities are elevated bilaterally. Toes are downgoing bilaterally.   Assessment/Plan:  One. Multiple sclerosis  2. Gait disorder  3. Memory disturbance  4. Monocular double vision, OS  The patient is ambulatory with a walker, and she can walk independently for short distances. The patient does have some ataxia of the lower extremities, but she has good strength throughout. This patient does not qualify for a power wheelchair, but she could benefit from a standard or light weight manual wheelchair for long-distance travel, as she has good strength and coordination of the upper extremities. The patient however, believes that she cannot use a standard  wheelchair. The patient is on no medications for multiple sclerosis as she is felt to have a secondary progressive course of multiple sclerosis. The patient is being followed by physical therapy, and occupational therapy was recommended. I will order this. The patient will followup in 6 months. The memory issue will be followed over time.  Marlan Palau MD 05/27/2013 8:26 PM  [facility] [address] [place], [place] [address]  Phone [phone] Fax [phone]

## 2013-06-01 ENCOUNTER — Ambulatory Visit: Payer: Medicare Other | Admitting: Physical Therapy

## 2013-06-03 ENCOUNTER — Ambulatory Visit: Payer: Medicare Other | Admitting: Physical Therapy

## 2013-06-10 ENCOUNTER — Ambulatory Visit: Payer: Medicare Other | Admitting: Occupational Therapy

## 2013-11-30 ENCOUNTER — Telehealth: Payer: Self-pay | Admitting: Nurse Practitioner

## 2013-11-30 ENCOUNTER — Ambulatory Visit: Payer: Medicare Other | Admitting: Nurse Practitioner

## 2013-11-30 NOTE — Telephone Encounter (Signed)
No show for scheduled appt 

## 2014-03-23 ENCOUNTER — Encounter (HOSPITAL_COMMUNITY): Payer: Self-pay | Admitting: Emergency Medicine

## 2014-03-23 ENCOUNTER — Emergency Department (HOSPITAL_COMMUNITY): Payer: Medicare Other

## 2014-03-23 ENCOUNTER — Emergency Department (HOSPITAL_COMMUNITY)
Admission: EM | Admit: 2014-03-23 | Discharge: 2014-03-23 | Disposition: A | Discharge status: Home / Self Care | Payer: Medicare Other | Attending: Emergency Medicine | Admitting: Emergency Medicine

## 2014-03-23 DIAGNOSIS — M549 Dorsalgia, unspecified: Secondary | ICD-10-CM | POA: Insufficient documentation

## 2014-03-23 DIAGNOSIS — E7800 Pure hypercholesterolemia, unspecified: Secondary | ICD-10-CM | POA: Insufficient documentation

## 2014-03-23 DIAGNOSIS — J441 Chronic obstructive pulmonary disease with (acute) exacerbation: Secondary | ICD-10-CM | POA: Insufficient documentation

## 2014-03-23 DIAGNOSIS — D72829 Elevated white blood cell count, unspecified: Secondary | ICD-10-CM | POA: Insufficient documentation

## 2014-03-23 DIAGNOSIS — Z79899 Other long term (current) drug therapy: Secondary | ICD-10-CM | POA: Insufficient documentation

## 2014-03-23 DIAGNOSIS — F13239 Sedative, hypnotic or anxiolytic dependence with withdrawal, unspecified: Secondary | ICD-10-CM

## 2014-03-23 DIAGNOSIS — R0600 Dyspnea, unspecified: Secondary | ICD-10-CM

## 2014-03-23 DIAGNOSIS — F411 Generalized anxiety disorder: Secondary | ICD-10-CM | POA: Insufficient documentation

## 2014-03-23 DIAGNOSIS — F13939 Sedative, hypnotic or anxiolytic use, unspecified with withdrawal, unspecified: Secondary | ICD-10-CM

## 2014-03-23 DIAGNOSIS — F329 Major depressive disorder, single episode, unspecified: Secondary | ICD-10-CM | POA: Insufficient documentation

## 2014-03-23 DIAGNOSIS — H9190 Unspecified hearing loss, unspecified ear: Secondary | ICD-10-CM | POA: Insufficient documentation

## 2014-03-23 DIAGNOSIS — F19939 Other psychoactive substance use, unspecified with withdrawal, unspecified: Secondary | ICD-10-CM | POA: Insufficient documentation

## 2014-03-23 DIAGNOSIS — R5383 Other fatigue: Secondary | ICD-10-CM

## 2014-03-23 DIAGNOSIS — F3289 Other specified depressive episodes: Secondary | ICD-10-CM | POA: Insufficient documentation

## 2014-03-23 DIAGNOSIS — R42 Dizziness and giddiness: Secondary | ICD-10-CM | POA: Insufficient documentation

## 2014-03-23 DIAGNOSIS — M4120 Other idiopathic scoliosis, site unspecified: Secondary | ICD-10-CM | POA: Insufficient documentation

## 2014-03-23 DIAGNOSIS — Z859 Personal history of malignant neoplasm, unspecified: Secondary | ICD-10-CM | POA: Insufficient documentation

## 2014-03-23 DIAGNOSIS — R630 Anorexia: Secondary | ICD-10-CM | POA: Insufficient documentation

## 2014-03-23 DIAGNOSIS — R5381 Other malaise: Secondary | ICD-10-CM | POA: Insufficient documentation

## 2014-03-23 DIAGNOSIS — R631 Polydipsia: Secondary | ICD-10-CM | POA: Insufficient documentation

## 2014-03-23 DIAGNOSIS — R45851 Suicidal ideations: Secondary | ICD-10-CM | POA: Insufficient documentation

## 2014-03-23 DIAGNOSIS — R300 Dysuria: Secondary | ICD-10-CM | POA: Insufficient documentation

## 2014-03-23 DIAGNOSIS — F17200 Nicotine dependence, unspecified, uncomplicated: Secondary | ICD-10-CM | POA: Insufficient documentation

## 2014-03-23 DIAGNOSIS — IMO0002 Reserved for concepts with insufficient information to code with codable children: Secondary | ICD-10-CM | POA: Insufficient documentation

## 2014-03-23 DIAGNOSIS — H532 Diplopia: Secondary | ICD-10-CM | POA: Insufficient documentation

## 2014-03-23 DIAGNOSIS — R6883 Chills (without fever): Secondary | ICD-10-CM | POA: Insufficient documentation

## 2014-03-23 DIAGNOSIS — E876 Hypokalemia: Secondary | ICD-10-CM

## 2014-03-23 LAB — COMPREHENSIVE METABOLIC PANEL
ALK PHOS: 140 U/L — AB (ref 39–117)
ALT: 10 U/L (ref 0–35)
AST: 19 U/L (ref 0–37)
Albumin: 3.9 g/dL (ref 3.5–5.2)
BUN: 17 mg/dL (ref 6–23)
CHLORIDE: 93 meq/L — AB (ref 96–112)
CO2: 20 mEq/L (ref 19–32)
Calcium: 9.4 mg/dL (ref 8.4–10.5)
Creatinine, Ser: 1.19 mg/dL — ABNORMAL HIGH (ref 0.50–1.10)
GFR calc Af Amer: 52 mL/min — ABNORMAL LOW (ref >90)
GFR calc non Af Amer: 45 mL/min — ABNORMAL LOW (ref >90)
GLUCOSE: 128 mg/dL — AB (ref 70–99)
POTASSIUM: 3 meq/L — AB (ref 3.7–5.3)
Sodium: 134 mEq/L — ABNORMAL LOW (ref 137–147)
TOTAL PROTEIN: 6.7 g/dL (ref 6.0–8.3)
Total Bilirubin: 0.4 mg/dL (ref 0.3–1.2)

## 2014-03-23 LAB — BLOOD GAS, ARTERIAL
Acid-base deficit: 1.5 mmol/L (ref 0.0–2.0)
Bicarbonate: 21.5 mEq/L (ref 20.0–24.0)
DRAWN BY: 307971
O2 CONTENT: 4 L/min
O2 Saturation: 94.5 %
Patient temperature: 98.6
TCO2: 17.9 mmol/L (ref 0–100)
pCO2 arterial: 33.7 mmHg — ABNORMAL LOW (ref 35.0–45.0)
pH, Arterial: 7.421 (ref 7.350–7.450)
pO2, Arterial: 73.1 mmHg — ABNORMAL LOW (ref 80.0–100.0)

## 2014-03-23 LAB — URINALYSIS, ROUTINE W REFLEX MICROSCOPIC
Glucose, UA: NEGATIVE mg/dL
Hgb urine dipstick: NEGATIVE
Ketones, ur: >80 mg/dL — AB
LEUKOCYTES UA: NEGATIVE
NITRITE: NEGATIVE
PROTEIN: NEGATIVE mg/dL
Specific Gravity, Urine: 1.02 (ref 1.005–1.030)
UROBILINOGEN UA: 1 mg/dL (ref 0.0–1.0)
pH: 5.5 (ref 5.0–8.0)

## 2014-03-23 LAB — PROTIME-INR
INR: 1.08 (ref 0.00–1.49)
Prothrombin Time: 13.8 seconds (ref 11.6–15.2)

## 2014-03-23 LAB — CBC WITH DIFFERENTIAL/PLATELET
BASOS PCT: 0 % (ref 0–1)
Basophils Absolute: 0 10*3/uL (ref 0.0–0.1)
Eosinophils Absolute: 0 10*3/uL (ref 0.0–0.7)
Eosinophils Relative: 0 % (ref 0–5)
HCT: 50.3 % — ABNORMAL HIGH (ref 36.0–46.0)
HEMOGLOBIN: 17.6 g/dL — AB (ref 12.0–15.0)
LYMPHS ABS: 1.4 10*3/uL (ref 0.7–4.0)
Lymphocytes Relative: 10 % — ABNORMAL LOW (ref 12–46)
MCH: 33.6 pg (ref 26.0–34.0)
MCHC: 35 g/dL (ref 30.0–36.0)
MCV: 96 fL (ref 78.0–100.0)
Monocytes Absolute: 1.4 10*3/uL — ABNORMAL HIGH (ref 0.1–1.0)
Monocytes Relative: 10 % (ref 3–12)
NEUTROS ABS: 11.7 10*3/uL — AB (ref 1.7–7.7)
NEUTROS PCT: 80 % — AB (ref 43–77)
PLATELETS: 236 10*3/uL (ref 150–400)
RBC: 5.24 MIL/uL — AB (ref 3.87–5.11)
RDW: 12.5 % (ref 11.5–15.5)
WBC: 14.6 10*3/uL — ABNORMAL HIGH (ref 4.0–10.5)

## 2014-03-23 LAB — TROPONIN I

## 2014-03-23 LAB — URINE MICROSCOPIC-ADD ON

## 2014-03-23 LAB — D-DIMER, QUANTITATIVE: D-Dimer, Quant: <0.27 ug/mL-FEU (ref 0.00–0.48)

## 2014-03-23 MED ORDER — POTASSIUM CHLORIDE CRYS ER 20 MEQ PO TBCR
40.0000 meq | EXTENDED_RELEASE_TABLET | Freq: Once | ORAL | Status: AC
  Administered 2014-03-23: 40 meq via ORAL
  Filled 2014-03-23: qty 2

## 2014-03-23 MED ORDER — POTASSIUM CHLORIDE CRYS ER 20 MEQ PO TBCR: 20.0000 meq | EXTENDED_RELEASE_TABLET | Freq: Two times a day (BID) | ORAL | Status: DC

## 2014-03-23 MED ORDER — IPRATROPIUM-ALBUTEROL 0.5-2.5 (3) MG/3ML IN SOLN
3.0000 mL | Freq: Once | RESPIRATORY_TRACT | Status: AC
  Administered 2014-03-23: 3 mL via RESPIRATORY_TRACT
  Filled 2014-03-23: qty 3

## 2014-03-23 MED ORDER — LORAZEPAM 2 MG/ML IJ SOLN
0.5000 mg | Freq: Once | INTRAMUSCULAR | Status: AC
  Administered 2014-03-23: 0.5 mg via INTRAVENOUS
  Filled 2014-03-23: qty 1

## 2014-03-23 MED ORDER — SODIUM CHLORIDE 0.9 % IV BOLUS (SEPSIS)
1000.0000 mL | Freq: Once | INTRAVENOUS | Status: AC
  Administered 2014-03-23: 1000 mL via INTRAVENOUS

## 2014-03-23 NOTE — ED Provider Notes (Signed)
CSN: [id number]     Arrival date & time 03/23/14  1203 History   First MD Initiated Contact with Patient 03/23/14 1308     Chief Complaint  Patient presents with  . Shortness of Breath    Level 5 confusion (Consider location/radiation/quality/duration/timing/severity/associated sxs/prior Treatment) HPI 73 y.o. Female with history of ms presents complaining sudden onset of dyspnea today.  She states this occurs sometimes when she talks too much but usually resolves when she stops.  Today she felt like she couldn't get a deep breath. She states she is out of her clonazepam for five days and has not eaten during this time.  She states it is like taking lsd.  She states she has had previous klonopin withdrawal.  She has not had it refilled because she has to go in to see her psychiatrist to get it refilled.  She has had some cough but not productive.  She is on oxygen during the night and sometimes during the day. She states she didn't realize she had low oxygen because she can't tell reality. She feels like her urine is leaking out.  She is a smoker and still smokes.  Denies etoh.   Past Medical History  Diagnosis Date  . Multiple sclerosis   . COPD (chronic obstructive pulmonary disease)   . Cancer   . Scoliosis   . Depression   . High cholesterol   . Abnormality of gait 05/27/2013  . Memory deficit 05/27/2013  . Hearing deficit   . Diplopia     Monocular, OS   Past Surgical History  Procedure Laterality Date  . Abdominal hysterectomy    . Ovarian cyst removal  2008  . Cholecystectomy  2006  . Cesarean section  1980  . Appendectomy  1952   Family History  Problem Relation Age of Onset  . Stroke Mother   . Heart disease Mother   . Breast cancer Mother   . Pancreatitis Mother   . Leukemia Father   . Heart disease Father   . Pancreatitis Sister   . Heart disease Brother   . Heart disease Maternal Uncle   . Diverticulitis Paternal Aunt    History  Substance Use Topics  .  Smoking status: Current Every Day Smoker -- 0.50 packs/day  . Smokeless tobacco: Not on file  . Alcohol Use: No   OB History   Grav Para Term Preterm Abortions TAB SAB Ect Mult Living                 Review of Systems  Constitutional: Positive for chills, activity change, appetite change and fatigue. Negative for fever.  HENT: Positive for congestion. Negative for sore throat.   Eyes:       Permanent double vision from ms  Respiratory: Positive for cough and shortness of breath.   Cardiovascular: Negative for leg swelling.  Gastrointestinal: Negative for nausea and vomiting.  Endocrine: Positive for polydipsia.  Genitourinary: Positive for difficulty urinating.  Musculoskeletal: Positive for back pain and gait problem.  Skin: Negative for rash.  Allergic/Immunologic: Negative.   Neurological: Positive for dizziness.  Hematological: Bruises/bleeds easily.  Psychiatric/Behavioral: Positive for agitation.      Allergies  Dilantin; Lactose intolerance (gi); and Sulfa antibiotics  Home Medications   Prior to Admission medications   Medication Sig Start Date End Date Taking? Authorizing Provider  acetaminophen (TYLENOL) 500 MG tablet Take 1,500 mg by mouth as needed for pain (up to 4,000 mg).   Yes Historical Provider, MD  albuterol (PROVENTIL HFA;VENTOLIN HFA) 108 (90 BASE) MCG/ACT inhaler Inhale 2 puffs into the lungs every 6 (six) hours as needed for wheezing. Uses proair   Yes Historical Provider, MD  atorvastatin (LIPITOR) 20 MG tablet Take 20 mg by mouth daily.   Yes Historical Provider, MD  clonazePAM (KLONOPIN) 2 MG tablet Take 2 mg by mouth daily.    Yes Historical Provider, MD  gabapentin (NEURONTIN) 300 MG capsule Take 900 mg by mouth 4 (four) times daily.   Yes Historical Provider, MD  guaiFENesin (MUCINEX) 600 MG 12 hr tablet Take 1,200 mg by mouth 2 (two) times daily.   Yes Historical Provider, MD  modafinil (PROVIGIL) 100 MG tablet Take 100 mg by mouth 3 (three)  times daily.   Yes Historical Provider, MD  OVER THE COUNTER MEDICATION Take 1-2 tablets by mouth as needed (as needed for allergies). Patient taking generic allergy medicine-does not know name   Yes Historical Provider, MD  tiotropium (SPIRIVA) 18 MCG inhalation capsule Place 18 mcg into inhaler and inhale daily.   Yes Historical Provider, MD  traZODone (DESYREL) 150 MG tablet Take 150 mg by mouth at bedtime.   Yes Historical Provider, MD  venlafaxine XR (EFFEXOR-XR) 150 MG 24 hr capsule Take 300 mg by mouth daily.   Yes Historical Provider, MD   BP 117/61  Pulse 89  Temp(Src) 98.6 F (37 C) (Oral)  Resp 19  SpO2 94% Physical Exam  Nursing note and vitals reviewed. Constitutional: She appears well-developed and well-nourished.  HENT:  Head: Normocephalic and atraumatic.  Eyes: Conjunctivae are normal. Pupils are equal, round, and reactive to light.  Neck: Normal range of motion. Neck supple.  Cardiovascular: Normal rate, regular rhythm and normal heart sounds.   Pulmonary/Chest: She has no wheezes. She has no rales.  Breath sounds decreased throughout with prolonged expiratory phase but no overt wheezing noted  Abdominal: Soft. Bowel sounds are normal.  Musculoskeletal: Normal range of motion.  Neurological: She is alert. She has normal reflexes.  Skin: Skin is warm and dry.  Psychiatric: Her mood appears anxious. Her affect is labile. She is agitated. She is not aggressive, not hyperactive and not combative. Thought content is not paranoid. Cognition and memory are normal. She expresses suicidal ideation. She expresses no homicidal ideation.    ED Course  Procedures (including critical care time) Labs Review Labs Reviewed  CBC WITH DIFFERENTIAL - Abnormal; Notable for the following:    WBC 14.6 (*)    RBC 5.24 (*)    Hemoglobin 17.6 (*)    HCT 50.3 (*)    Neutrophils Relative % 80 (*)    Neutro Abs 11.7 (*)    Lymphocytes Relative 10 (*)    Monocytes Absolute 1.4 (*)     All other components within normal limits  COMPREHENSIVE METABOLIC PANEL - Abnormal; Notable for the following:    Sodium 134 (*)    Potassium 3.0 (*)    Chloride 93 (*)    Glucose, Bld 128 (*)    Creatinine, Ser 1.19 (*)    Alkaline Phosphatase 140 (*)    GFR calc non Af Amer 45 (*)    GFR calc Af Amer 52 (*)    All other components within normal limits  BLOOD GAS, ARTERIAL - Abnormal; Notable for the following:    pCO2 arterial 33.7 (*)    pO2, Arterial 73.1 (*)    All other components within normal limits  URINALYSIS, ROUTINE W REFLEX MICROSCOPIC - Abnormal; Notable for  the following:    Color, Urine AMBER (*)    APPearance TURBID (*)    Bilirubin Urine MODERATE (*)    Ketones, ur >80 (*)    All other components within normal limits  URINE MICROSCOPIC-ADD ON - Abnormal; Notable for the following:    Squamous Epithelial / LPF MANY (*)    Bacteria, UA FEW (*)    Casts HYALINE CASTS (*)    All other components within normal limits  URINE CULTURE  PROTIME-INR  D-DIMER, QUANTITATIVE  TROPONIN I    Imaging Review No results found.   EKG Interpretation   Date/Time:  Wednesday March 23 2014 12:13:29 EDT Ventricular Rate:  92 PR Interval:  156 QRS Duration: 86 QT Interval:  385 QTC Calculation: 476 R Axis:   137 Text Interpretation:  Right and left arm electrode reversal,  interpretation assumes no reversal Sinus rhythm Probable lateral infarct,  age indeterminate Confirmed by Yicel Shannon MD, Andee Poles [phone]) on 03/23/2014  3:44:05 PM      MDM  Patient received IV fluids and had Ativan 1 mg while in emergency department and feels improved.  Patient is mildly hypokalemic and is having her potassium orally repleted. Patient has an elevated white blood cell count but I do not see any definite focal infection. Just a few bacteria but no white blood cells and is nitrite negative. She does appear somewhat volume depleted with greater than 80 ketones in her urine and has received IV  fluids secondary this. I suspect that most of her symptoms are due to benzodiazepine withdrawal. I am attempting to contact her psychiatrist to find out why her Klonopin has not been refilled. Patient has been very vague on this and has stated that the pharmacy he needed to receive a fax. I discussed with the patient's doctor's office and with the pharmacy and she has Clonopin available. I discussed this with the patient and she will go and have this filled tonight. I have discussed this with Dr. Felipa Eth and he feels that the patient is at her baseline. I discussed the plan with patient and she voices understanding. Final diagnoses:  Dyspnea  Benzodiazepine withdrawal  Hypokalemia        Shaune Pollack, MD 03/23/14 [phone]

## 2014-03-23 NOTE — Discharge Instructions (Signed)
You can have your Clonopin refilled today. [facility] on [address] has your prescription ready. Please recheck with Dr. Felipa Eth in the next one to 3 days. Please take the potassium as prescribed

## 2014-03-23 NOTE — ED Notes (Signed)
Bed: WA21 Expected date:  Expected time:  Means of arrival:  Comments: EMS short of breath

## 2014-03-23 NOTE — ED Notes (Signed)
Pt to ED from home with c/o SOB on exertion and dizziness.  Pt reports difficulty walking r/t MS however worse over the past couple of weeks.  Denies pain.

## 2014-03-24 LAB — URINE CULTURE
CULTURE: NO GROWTH
Colony Count: NO GROWTH
Special Requests: NORMAL

## 2014-12-17 ENCOUNTER — Emergency Department (HOSPITAL_COMMUNITY): Payer: Medicare Other

## 2014-12-17 ENCOUNTER — Emergency Department (HOSPITAL_COMMUNITY)
Admission: EM | Admit: 2014-12-17 | Discharge: 2014-12-17 | Disposition: A | Discharge status: Home / Self Care | Payer: Medicare Other | Attending: Emergency Medicine | Admitting: Emergency Medicine

## 2014-12-17 ENCOUNTER — Encounter (HOSPITAL_COMMUNITY): Payer: Self-pay | Admitting: Radiology

## 2014-12-17 DIAGNOSIS — F13239 Sedative, hypnotic or anxiolytic dependence with withdrawal, unspecified: Secondary | ICD-10-CM

## 2014-12-17 DIAGNOSIS — Z79899 Other long term (current) drug therapy: Secondary | ICD-10-CM | POA: Insufficient documentation

## 2014-12-17 DIAGNOSIS — G40909 Epilepsy, unspecified, not intractable, without status epilepticus: Secondary | ICD-10-CM | POA: Insufficient documentation

## 2014-12-17 DIAGNOSIS — F329 Major depressive disorder, single episode, unspecified: Secondary | ICD-10-CM | POA: Diagnosis not present

## 2014-12-17 DIAGNOSIS — M419 Scoliosis, unspecified: Secondary | ICD-10-CM | POA: Insufficient documentation

## 2014-12-17 DIAGNOSIS — F1510 Other stimulant abuse, uncomplicated: Secondary | ICD-10-CM | POA: Diagnosis not present

## 2014-12-17 DIAGNOSIS — Z760 Encounter for issue of repeat prescription: Secondary | ICD-10-CM | POA: Insufficient documentation

## 2014-12-17 DIAGNOSIS — R569 Unspecified convulsions: Secondary | ICD-10-CM

## 2014-12-17 DIAGNOSIS — Z859 Personal history of malignant neoplasm, unspecified: Secondary | ICD-10-CM | POA: Insufficient documentation

## 2014-12-17 DIAGNOSIS — Z720 Tobacco use: Secondary | ICD-10-CM | POA: Diagnosis not present

## 2014-12-17 DIAGNOSIS — J449 Chronic obstructive pulmonary disease, unspecified: Secondary | ICD-10-CM | POA: Insufficient documentation

## 2014-12-17 DIAGNOSIS — F13939 Sedative, hypnotic or anxiolytic use, unspecified with withdrawal, unspecified: Secondary | ICD-10-CM

## 2014-12-17 DIAGNOSIS — E780 Pure hypercholesterolemia: Secondary | ICD-10-CM | POA: Diagnosis not present

## 2014-12-17 DIAGNOSIS — G35 Multiple sclerosis: Secondary | ICD-10-CM | POA: Diagnosis not present

## 2014-12-17 DIAGNOSIS — H9190 Unspecified hearing loss, unspecified ear: Secondary | ICD-10-CM | POA: Diagnosis not present

## 2014-12-17 LAB — CBC WITH DIFFERENTIAL/PLATELET
BASOS ABS: 0 10*3/uL (ref 0.0–0.1)
BASOS PCT: 0 % (ref 0–1)
EOS ABS: 0 10*3/uL (ref 0.0–0.7)
Eosinophils Relative: 0 % (ref 0–5)
HCT: 54.3 % — ABNORMAL HIGH (ref 36.0–46.0)
Hemoglobin: 18.4 g/dL — ABNORMAL HIGH (ref 12.0–15.0)
LYMPHS PCT: 16 % (ref 12–46)
Lymphs Abs: 2.5 10*3/uL (ref 0.7–4.0)
MCH: 34.8 pg — ABNORMAL HIGH (ref 26.0–34.0)
MCHC: 33.9 g/dL (ref 30.0–36.0)
MCV: 102.8 fL — AB (ref 78.0–100.0)
MONO ABS: 1 10*3/uL (ref 0.1–1.0)
MONOS PCT: 7 % (ref 3–12)
Neutro Abs: 11.8 10*3/uL — ABNORMAL HIGH (ref 1.7–7.7)
Neutrophils Relative %: 77 % (ref 43–77)
Platelets: 280 10*3/uL (ref 150–400)
RBC: 5.28 MIL/uL — ABNORMAL HIGH (ref 3.87–5.11)
RDW: 12.6 % (ref 11.5–15.5)
WBC: 15.4 10*3/uL — ABNORMAL HIGH (ref 4.0–10.5)

## 2014-12-17 LAB — BASIC METABOLIC PANEL
BUN: 8 mg/dL (ref 6–23)
CALCIUM: 9 mg/dL (ref 8.4–10.5)
CHLORIDE: 101 mmol/L (ref 96–112)
CO2: 13 mmol/L — AB (ref 19–32)
CREATININE: 0.87 mg/dL (ref 0.50–1.10)
GFR calc Af Amer: 75 mL/min — ABNORMAL LOW (ref >90)
GFR calc non Af Amer: 65 mL/min — ABNORMAL LOW (ref >90)
GLUCOSE: 264 mg/dL — AB (ref 70–99)
Potassium: 3.1 mmol/L — ABNORMAL LOW (ref 3.5–5.1)
Sodium: 135 mmol/L (ref 135–145)

## 2014-12-17 LAB — RAPID URINE DRUG SCREEN, HOSP PERFORMED
AMPHETAMINES: POSITIVE — AB
BENZODIAZEPINES: POSITIVE — AB
Barbiturates: NOT DETECTED
Cocaine: NOT DETECTED
Opiates: NOT DETECTED
Tetrahydrocannabinol: NOT DETECTED

## 2014-12-17 LAB — ETHANOL

## 2014-12-17 MED ORDER — CLONAZEPAM 0.5 MG PO TABS: 0.5000 mg | ORAL_TABLET | Freq: Every day | ORAL | Status: AC

## 2014-12-17 MED ORDER — CLONAZEPAM 1 MG PO TABS: 1.0000 mg | ORAL_TABLET | Freq: Two times a day (BID) | ORAL | Status: DC

## 2014-12-17 MED ORDER — CLONAZEPAM 0.5 MG PO TABS
2.0000 mg | ORAL_TABLET | Freq: Once | ORAL | Status: AC
  Administered 2014-12-17: 2 mg via ORAL
  Filled 2014-12-17: qty 4

## 2014-12-17 MED ORDER — LORAZEPAM 2 MG/ML IJ SOLN
1.0000 mg | INTRAMUSCULAR | Status: DC | PRN
  Administered 2014-12-17: 1 mg via INTRAVENOUS
  Filled 2014-12-17: qty 1

## 2014-12-17 NOTE — ED Notes (Signed)
No other seizures noted this E.D. Visit.  She is now awake, alert and in no distress.  Taken to her vehicle per w/c r/t m.s. Chronic weakness/ataxia.

## 2014-12-17 NOTE — ED Notes (Signed)
Her daughter is with her and tells [place] pt. Had a seizure earlier this morning; and she had another one upon arrival to E.D. Which lasted ~45 seconds; mainly involving upper extremities in flexed position.  She has bitten her tongue and been incontinent of sm. Amt. Of urine.  She states pt. Has been unable to obtain her Klonipin for past three days.

## 2014-12-17 NOTE — ED Notes (Signed)
Bed: JF59 Expected date: 12/17/14 Expected time: 10:07 AM Means of arrival: Ambulance Comments: ?Seizure

## 2014-12-17 NOTE — ED Notes (Signed)
She is awake, slightly drowsy and in no distress.  Her daughter has gone home to get her some clothing.

## 2014-12-17 NOTE — ED Provider Notes (Addendum)
CSN: [id number]     Arrival date & time 12/17/14  1034 History   First MD Initiated Contact with Patient 12/17/14 1034     Chief Complaint  Patient presents with  . Seizures      HPI  Patient presents via EMS with 2 apparent seizures prior to arrival. Patient takes Klonopin 2 mg either twice a day, or 3 times a day per her daughter's report. Daughter states that her mother's last dose was Wednesday. On Thursday she was out and tried to call her physician. With the weather her physician's office was closed. She has not been able to get her refill. Daughter states this morning she was "really shaky". While speaking with her daughter mother had a generalized seizure. Should with her arms or legs and her "eyes rolled back". Postictal on arrival of paramedics. However she was awake to the point is speaking in route.  However, had a second generalized seizure just prior to arrival here. Did bite her tongue. Was not incontinent. Did not start focally.  Past Medical History  Diagnosis Date  . Multiple sclerosis   . COPD (chronic obstructive pulmonary disease)   . Cancer   . Scoliosis   . Depression   . High cholesterol   . Abnormality of gait 05/27/2013  . Memory deficit 05/27/2013  . Hearing deficit   . Diplopia     Monocular, OS   Past Surgical History  Procedure Laterality Date  . Abdominal hysterectomy    . Ovarian cyst removal  2008  . Cholecystectomy  2006  . Cesarean section  1980  . Appendectomy  1952   Family History  Problem Relation Age of Onset  . Stroke Mother   . Heart disease Mother   . Breast cancer Mother   . Pancreatitis Mother   . Leukemia Father   . Heart disease Father   . Pancreatitis Sister   . Heart disease Brother   . Heart disease Maternal Uncle   . Diverticulitis Paternal Aunt    History  Substance Use Topics  . Smoking status: Current Every Day Smoker -- 0.50 packs/day  . Smokeless tobacco: Not on file  . Alcohol Use: No   OB History    No data  available     Review of Systems  Unable to perform ROS: Other      Allergies  Dilantin; Lactose intolerance (gi); and Sulfa antibiotics  Home Medications   Prior to Admission medications   Medication Sig Start Date End Date Taking? Authorizing Provider  acetaminophen (TYLENOL) 500 MG tablet Take 1,500 mg by mouth as needed for pain (up to 4,000 mg).   Yes Historical Provider, MD  albuterol (PROVENTIL HFA;VENTOLIN HFA) 108 (90 BASE) MCG/ACT inhaler Inhale 2 puffs into the lungs every 6 (six) hours as needed for wheezing. Uses proair   Yes Historical Provider, MD  atorvastatin (LIPITOR) 20 MG tablet Take 20 mg by mouth daily.   Yes Historical Provider, MD  dalfampridine 10 MG TB12 Take 10 mg by mouth daily.    Yes Historical Provider, MD  gabapentin (NEURONTIN) 300 MG capsule Take 900 mg by mouth 4 (four) times daily.   Yes Historical Provider, MD  modafinil (PROVIGIL) 100 MG tablet Take 100 mg by mouth 3 (three) times daily.   Yes Historical Provider, MD  traZODone (DESYREL) 150 MG tablet Take 150 mg by mouth at bedtime.   Yes Historical Provider, MD  Umeclidinium-Vilanterol 62.5-25 MCG/INH AEPB Inhale 1 puff into the lungs daily.  Yes Historical Provider, MD  venlafaxine XR (EFFEXOR-XR) 150 MG 24 hr capsule Take 300 mg by mouth daily.   Yes Historical Provider, MD  clonazePAM (KLONOPIN) 0.5 MG tablet Take 1 tablet (0.5 mg total) by mouth daily. 12/17/14   Tanna Furry, MD   BP 116/56 mmHg  Pulse 95  Temp(Src) 98 F (36.7 C) (Rectal)  Resp 19  SpO2 91% Physical Exam  Constitutional: She is oriented to person, place, and time. She appears well-developed and well-nourished. No distress.  HENT:  Head: Normocephalic.  Contusion to the anterior tongue.  Eyes: Conjunctivae are normal. Pupils are equal, round, and reactive to light. No scleral icterus.  Neck: Normal range of motion. Neck supple. No thyromegaly present.  Cardiovascular: Normal rate and regular rhythm.  Exam reveals no  gallop and no friction rub.   No murmur heard. Pulmonary/Chest: Effort normal and breath sounds normal. No respiratory distress. She has no wheezes. She has no rales.  Abdominal: Soft. Bowel sounds are normal. She exhibits no distension. There is no tenderness. There is no rebound.  Musculoskeletal: Normal range of motion.  Neurological: She is alert and oriented to person, place, and time.  Withdraws all 4 extremity's. Mental status slowly improving. Protecting her airway.  Skin: Skin is warm and dry. No rash noted.  Psychiatric: She has a normal mood and affect. Her behavior is normal.    ED Course  Procedures (including critical care time) Labs Review Labs Reviewed  CBC WITH DIFFERENTIAL/PLATELET - Abnormal; Notable for the following:    WBC 15.4 (*)    RBC 5.28 (*)    Hemoglobin 18.4 (*)    HCT 54.3 (*)    MCV 102.8 (*)    MCH 34.8 (*)    Neutro Abs 11.8 (*)    All other components within normal limits  BASIC METABOLIC PANEL - Abnormal; Notable for the following:    Potassium 3.1 (*)    CO2 13 (*)    Glucose, Bld 264 (*)    GFR calc non Af Amer 65 (*)    GFR calc Af Amer 75 (*)    All other components within normal limits  URINE RAPID DRUG SCREEN (HOSP PERFORMED) - Abnormal; Notable for the following:    Benzodiazepines POSITIVE (*)    Amphetamines POSITIVE (*)    All other components within normal limits  ETHANOL    Imaging Review Ct Head Wo Contrast  12/17/2014   CLINICAL DATA:  Recent seizures  EXAM: CT HEAD WITHOUT CONTRAST  TECHNIQUE: Contiguous axial images were obtained from the base of the skull through the vertex without intravenous contrast.  COMPARISON:  02/04/2013  FINDINGS: Bony calvarium is intact. Mild atrophic changes and mild chronic white matter ischemic changes noted. No findings to suggest acute hemorrhage, acute infarction or space-occupying mass lesion are seen.  IMPRESSION: Chronic atrophic and ischemic changes. No acute abnormality is noted.    Electronically Signed   By: Inez Catalina M.D.   On: 12/17/2014 11:34     EKG Interpretation None      MDM   Final diagnoses:  Seizure  Benzodiazepine withdrawal, with unspecified complication    History and symptoms and findings most consistent with benzodiazepine withdrawal. Given Ativan IV. Labs and CT pending.  Patient recovers fully to her baseline. Awake and alert. Mentating well. She takes 2 mg of clonazepam once daily has been out for 2 days. Given by mouth Klonopin here. Discharged home. Refill 2 week. See primary care for further medicine  needs.    Tanna Furry, MD 12/17/14 [place], MD 12/17/14 [phone]

## 2014-12-17 NOTE — Discharge Instructions (Signed)
Benzodiazepine Withdrawal  Benzodiazepines are a group of drugs that are prescribed for both short-term and long-term treatment of a variety of medical conditions. For some of these conditions, such as seizures and sudden and severe muscle spasms, they are used only for a few hours or a few days. For other conditions, such as anxiety, sleep problems, or frequent muscle spasms or to help prevent seizures, they are used for an extended period, usually weeks or months. Benzodiazepines work by changing the way your brain functions. Normally, chemicals in your brain called neurotransmitters send messages between your brain cells. The neurotransmitter that benzodiazepines affect is called gamma-aminobutyric acid (GABA). GABA sends out messages that have a calming effect on many of the functions of your brain. Benzodiazepines make these messages stronger and increase this calming effect. Short-term use of benzodiazepines usually does not cause problems when you stop taking the drugs. However, if you take benzodiazepines for a long time, your body can adjust to the drug and require more of it to produce the same effect (drug tolerance). Eventually, you can develop physical dependence on benzodiazepines, which is when you experience negative effects if your dosage of benzodiazepines is reduced or stopped too quickly. These negative effects are called symptoms of withdrawal. SYMPTOMS Symptoms of withdrawal may begin anytime within the first 10 days after you stop taking the benzodiazepine. They can last from several weeks up to a few months but usually are the worst between the first 10 to 14 days.  The actual symptoms also vary, depending on the type of benzodiazepine you take. Possible symptoms include:  Anxiety.  Excitability.  Irritability.  Depression.  Mood swings.  Trouble sleeping.  Confusion.  Uncontrollable shaking (tremors).  Muscle weakness.  Seizures. DIAGNOSIS To diagnose  benzodiazepine withdrawal, your caregiver will examine you for certain signs, such as:  Rapid heartbeat.  Rapid breathing.  Tremors.  High blood pressure.  Fever.  Mood changes. Your caregiver also may ask the following questions about your use of benzodiazepines:  What type of benzodiazepine did you take?  How much did you take each day?  How long did you take the drug?  When was the last time you took the drug?  Do you take any other drugs?  Have you had alcohol recently?  Have you had a seizure recently?  Have you lost consciousness recently?  Have you had trouble remembering recent events?  Have you had a recent increase in anxiety, irritability, or trouble sleeping? A drug test also may be administered. TREATMENT The treatment for benzodiazepine withdrawal can vary, depending on the type and severity of your symptoms, what type of benzodiazepine you have been taking, and how long you have been taking the benzodiazepine. Sometimes it is necessary for you to be treated in a hospital, especially if you are at risk of seizures.  Often, treatment includes a prescription for a long-acting benzodiazepine, the dosage of which is reduced slowly over a long period. This period could be several weeks or months. Eventually, your dosage will be reduced to a point that you can stop taking the drug, without experiencing withdrawal symptoms. This is called tapered withdrawal. Occasionally, minor symptoms of withdrawal continue for a few days or weeks after you have completed a tapered withdrawal. SEEK IMMEDIATE MEDICAL CARE IF:  You have a seizure.  You develop a craving for drugs or alcohol.  You begin to experience symptoms of withdrawal during your tapered withdrawal.  You become very confused.  You lose consciousness.  You  have trouble breathing.  You think about hurting yourself or someone else. Document Released: 10/31/2011 Document Revised: 02/03/2012 Document  Reviewed: 10/31/2011 [facility] Patient Information 2015 [place], [place]. This information is not intended to replace advice given to you by your health care provider. Make sure you discuss any questions you have with your health care provider.  Seizure, Adult A seizure is abnormal electrical activity in the brain. Seizures usually last from 30 seconds to 2 minutes. There are various types of seizures. Before a seizure, you may have a warning sensation (aura) that a seizure is about to occur. An aura may include the following symptoms:   Fear or anxiety.  Nausea.  Feeling like the room is spinning (vertigo).  Vision changes, such as seeing flashing lights or spots. Common symptoms during a seizure include:  A change in attention or behavior (altered mental status).  Convulsions with rhythmic jerking movements.  Drooling.  Rapid eye movements.  Grunting.  Loss of bladder and bowel control.  Bitter taste in the mouth.  Tongue biting. After a seizure, you may feel confused and sleepy. You may also have an injury resulting from convulsions during the seizure. HOME CARE INSTRUCTIONS   If you are given medicines, take them exactly as prescribed by your health care provider.  Keep all follow-up appointments as directed by your health care provider.  Do not swim or drive or engage in risky activity during which a seizure could cause further injury to you or others until your health care provider says it is OK.  Get adequate rest.  Teach friends and family what to do if you have a seizure. They should:  Lay you on the ground to prevent a fall.  Put a cushion under your head.  Loosen any tight clothing around your neck.  Turn you on your side. If vomiting occurs, this helps keep your airway clear.  Stay with you until you recover.  Know whether or not you need emergency care. SEEK IMMEDIATE MEDICAL CARE IF:  The seizure lasts longer than 5 minutes.  The seizure is  severe or you do not wake up immediately after the seizure.  You have an altered mental status after the seizure.  You are having more frequent or worsening seizures. Someone should drive you to the emergency department or call local emergency services (911 in [place] MAKE SURE YOU:  Understand these instructions.  Will watch your condition.  Will get help right away if you are not doing well or get worse. Document Released: 11/08/2000 Document Revised: 09/01/2013 Document Reviewed: 06/23/2013 [facility] Patient Information 2015 [place], [place]. This information is not intended to replace advice given to you by your health care provider. Make sure you discuss any questions you have with your health care provider.

## 2014-12-17 NOTE — ED Notes (Signed)
She has just returned from CT scan and is now awake and conversing with  Her daughter.  Her speech is clear and she moves all extremiites with ease on command.

## 2015-02-03 DIAGNOSIS — C3432 Malignant neoplasm of lower lobe, left bronchus or lung: Secondary | ICD-10-CM | POA: Insufficient documentation

## 2015-02-04 DIAGNOSIS — S22000A Wedge compression fracture of unspecified thoracic vertebra, initial encounter for closed fracture: Secondary | ICD-10-CM | POA: Insufficient documentation

## 2016-10-31 ENCOUNTER — Encounter (HOSPITAL_COMMUNITY): Payer: Self-pay | Admitting: Emergency Medicine

## 2016-10-31 ENCOUNTER — Ambulatory Visit (HOSPITAL_COMMUNITY)
Admission: EM | Admit: 2016-10-31 | Discharge: 2016-10-31 | Disposition: A | Discharge status: Home / Self Care | Payer: Medicare Other | Attending: Family Medicine | Admitting: Family Medicine

## 2016-10-31 ENCOUNTER — Emergency Department (HOSPITAL_COMMUNITY)
Admission: EM | Admit: 2016-10-31 | Discharge: 2016-10-31 | Discharge status: Absent without leave | Payer: Medicare Other

## 2016-10-31 DIAGNOSIS — G35 Multiple sclerosis: Secondary | ICD-10-CM | POA: Insufficient documentation

## 2016-10-31 DIAGNOSIS — Z888 Allergy status to other drugs, medicaments and biological substances status: Secondary | ICD-10-CM | POA: Insufficient documentation

## 2016-10-31 DIAGNOSIS — Z79899 Other long term (current) drug therapy: Secondary | ICD-10-CM | POA: Insufficient documentation

## 2016-10-31 DIAGNOSIS — J449 Chronic obstructive pulmonary disease, unspecified: Secondary | ICD-10-CM | POA: Insufficient documentation

## 2016-10-31 DIAGNOSIS — Z8249 Family history of ischemic heart disease and other diseases of the circulatory system: Secondary | ICD-10-CM | POA: Diagnosis not present

## 2016-10-31 DIAGNOSIS — Z823 Family history of stroke: Secondary | ICD-10-CM | POA: Diagnosis not present

## 2016-10-31 DIAGNOSIS — N390 Urinary tract infection, site not specified: Secondary | ICD-10-CM | POA: Diagnosis not present

## 2016-10-31 DIAGNOSIS — Z9889 Other specified postprocedural states: Secondary | ICD-10-CM | POA: Diagnosis not present

## 2016-10-31 DIAGNOSIS — F329 Major depressive disorder, single episode, unspecified: Secondary | ICD-10-CM | POA: Diagnosis not present

## 2016-10-31 DIAGNOSIS — E7800 Pure hypercholesterolemia, unspecified: Secondary | ICD-10-CM | POA: Diagnosis not present

## 2016-10-31 DIAGNOSIS — M419 Scoliosis, unspecified: Secondary | ICD-10-CM | POA: Diagnosis not present

## 2016-10-31 DIAGNOSIS — E876 Hypokalemia: Secondary | ICD-10-CM | POA: Insufficient documentation

## 2016-10-31 DIAGNOSIS — Z803 Family history of malignant neoplasm of breast: Secondary | ICD-10-CM | POA: Insufficient documentation

## 2016-10-31 DIAGNOSIS — G4700 Insomnia, unspecified: Secondary | ICD-10-CM | POA: Diagnosis not present

## 2016-10-31 DIAGNOSIS — F17210 Nicotine dependence, cigarettes, uncomplicated: Secondary | ICD-10-CM | POA: Insufficient documentation

## 2016-10-31 DIAGNOSIS — F419 Anxiety disorder, unspecified: Secondary | ICD-10-CM | POA: Insufficient documentation

## 2016-10-31 DIAGNOSIS — Z9049 Acquired absence of other specified parts of digestive tract: Secondary | ICD-10-CM | POA: Diagnosis not present

## 2016-10-31 LAB — POCT URINALYSIS DIP (DEVICE)
Bilirubin Urine: NEGATIVE
GLUCOSE, UA: NEGATIVE mg/dL
Ketones, ur: NEGATIVE mg/dL
NITRITE: NEGATIVE
PROTEIN: 30 mg/dL — AB
SPECIFIC GRAVITY, URINE: 1.025 (ref 1.005–1.030)
UROBILINOGEN UA: 1 mg/dL (ref 0.0–1.0)
pH: 6 (ref 5.0–8.0)

## 2016-10-31 MED ORDER — CIPROFLOXACIN HCL 500 MG PO TABS: 500.0000 mg | ORAL_TABLET | Freq: Two times a day (BID) | ORAL | 0 refills | Status: DC

## 2016-10-31 NOTE — ED Provider Notes (Signed)
[place]    CSN: [id number] Arrival date & time: 10/31/16  1602     History   Chief Complaint Chief Complaint  Patient presents with  . Urinary Tract Infection    HPI [name] is a 75 y.o. female.   This is a 75 year old woman who was diagnosed with urinary tract infection over Thanksgiving. She was started on Macrobid, but the symptoms did not improve and she was switched to Keflex when the urine culture suggested that this would work better. She finished her antibiotics earlier this week and within 24 hours of completion of the antibiotic, she had urinary tract infection symptoms once again.      Past Medical History:  Diagnosis Date  . Abnormality of gait 05/27/2013  . Cancer ([place])   . COPD (chronic obstructive pulmonary disease) ([place])   . Depression   . Diplopia    Monocular, OS  . Hearing deficit   . High cholesterol   . Memory deficit 05/27/2013  . Multiple sclerosis ([place])   . Scoliosis     Patient Active Problem List   Diagnosis Date Noted  . Abnormality of gait 05/27/2013  . Memory deficit 05/27/2013  . Leg weakness, bilateral 02/04/2013  . Multiple sclerosis ([place]) 02/04/2013  . COPD (chronic obstructive pulmonary disease) ([place]) 02/04/2013  . Depression 02/04/2013  . HLD (hyperlipidemia) 02/04/2013  . Anxiety 02/04/2013  . Insomnia 02/04/2013  . Hypokalemia 02/04/2013    Past Surgical History:  Procedure Laterality Date  . ABDOMINAL HYSTERECTOMY    . APPENDECTOMY  1952  . CESAREAN SECTION  1980  . CHOLECYSTECTOMY  2006  . OVARIAN CYST REMOVAL  2008    OB History    No data available       Home Medications    Prior to Admission medications   Medication Sig Start Date End Date Taking? Authorizing Provider  clonazePAM (KLONOPIN) 1 MG tablet Take 1 tablet (1 mg total) by mouth 2 (two) times daily. 12/17/14  Yes Tanna Furry, MD  traZODone (DESYREL) 150 MG tablet Take 150 mg by mouth at bedtime.   Yes Historical Provider, MD    venlafaxine XR (EFFEXOR-XR) 150 MG 24 hr capsule Take 300 mg by mouth daily.   Yes Historical Provider, MD  acetaminophen (TYLENOL) 500 MG tablet Take 1,500 mg by mouth as needed for pain (up to 4,000 mg).    Historical Provider, MD  albuterol (PROVENTIL HFA;VENTOLIN HFA) 108 (90 BASE) MCG/ACT inhaler Inhale 2 puffs into the lungs every 6 (six) hours as needed for wheezing. Uses proair    Historical Provider, MD  atorvastatin (LIPITOR) 20 MG tablet Take 20 mg by mouth daily.    Historical Provider, MD  ciprofloxacin (CIPRO) 500 MG tablet Take 1 tablet (500 mg total) by mouth 2 (two) times daily. 10/31/16   Robyn Haber, MD  clonazePAM (KLONOPIN) 0.5 MG tablet Take 1 tablet (0.5 mg total) by mouth daily. 12/17/14   Tanna Furry, MD  dalfampridine 10 MG TB12 Take 10 mg by mouth daily.     Historical Provider, MD  gabapentin (NEURONTIN) 300 MG capsule Take 900 mg by mouth 4 (four) times daily.    Historical Provider, MD  modafinil (PROVIGIL) 100 MG tablet Take 100 mg by mouth 3 (three) times daily.    Historical Provider, MD  Umeclidinium-Vilanterol 62.5-25 MCG/INH AEPB Inhale 1 puff into the lungs daily.    Historical Provider, MD    Family History Family History  Problem Relation Age of Onset  .  Stroke Mother   . Heart disease Mother   . Breast cancer Mother   . Pancreatitis Mother   . Leukemia Father   . Heart disease Father   . Pancreatitis Sister   . Heart disease Brother   . Heart disease Maternal Uncle   . Diverticulitis Paternal Aunt     Social History Social History  Substance Use Topics  . Smoking status: Current Every Day Smoker    Packs/day: 1.00  . Smokeless tobacco: Never Used  . Alcohol use No     Allergies   Dilantin [phenytoin]; Lactose intolerance (gi); and Sulfa antibiotics   Review of Systems Review of Systems  Constitutional: Negative.   Genitourinary: Positive for dysuria and frequency.     Physical Exam Triage Vital Signs ED Triage Vitals  Enc  Vitals Group     BP 10/31/16 1622 117/75     Pulse Rate 10/31/16 1622 89     Resp --      Temp 10/31/16 1622 98.3 F (36.8 C)     Temp Source 10/31/16 1622 Oral     SpO2 10/31/16 1622 90 %     Weight --      Height --      Head Circumference --      Peak Flow --      Pain Score 10/31/16 1621 5     Pain Loc --      Pain Edu? --      Excl. in [place]? --    No data found.   Updated Vital Signs BP 117/75 (BP Location: Left Arm)   Pulse 89   Temp 98.3 F (36.8 C) (Oral)   SpO2 90%    Physical Exam  Constitutional: She is oriented to person, place, and time. She appears well-developed and well-nourished.  HENT:  Right Ear: External ear normal.  Left Ear: External ear normal.  Mouth/Throat: Oropharynx is clear and moist.  Eyes: Conjunctivae and EOM are normal.  Neck: Normal range of motion. Neck supple.  Pulmonary/Chest: Effort normal.  Abdominal: Soft. There is no tenderness.  Musculoskeletal: Normal range of motion.  Neurological: She is alert and oriented to person, place, and time.  Skin: Skin is warm and dry.  Nursing note and vitals reviewed.    UC Treatments / Results  Labs (all labs ordered are listed, but only abnormal results are displayed) Labs Reviewed  POCT URINALYSIS DIP (DEVICE) - Abnormal; Notable for the following:       Result Value   Hgb urine dipstick MODERATE (*)    Protein, ur 30 (*)    Leukocytes, UA SMALL (*)    All other components within normal limits  URINE CULTURE    EKG  EKG Interpretation None       Radiology No results found.  Procedures Procedures (including critical care time)  Medications Ordered in UC Medications - No data to display   Initial Impression / Assessment and Plan / UC Course  I have reviewed the triage vital signs and the nursing notes.  Pertinent labs & imaging results that were available during my care of the patient were reviewed by me and considered in my medical decision making (see chart for  details).  Clinical Course     Final Clinical Impressions(s) / UC Diagnoses   Final diagnoses:  Lower urinary tract infectious disease    New Prescriptions New Prescriptions   CIPROFLOXACIN (CIPRO) 500 MG TABLET    Take 1 tablet (500 mg total) by mouth  2 (two) times daily.     Robyn Haber, MD 10/31/16 [phone]

## 2016-10-31 NOTE — ED Triage Notes (Signed)
Pt was diagnosed with a UTI right before Thanksgiving.  She was prescribed Macrobid but was later told she her infection was resistant to [facility] and she was prescribed Keflex.  Pt finished her Rx on Monday and her symptoms came back by the next day and she states they are worse.    Her symptoms are frequent urination that is painful.  Pt has felt like she has had a fever but does not have a thermometer.

## 2016-11-03 LAB — URINE CULTURE: Culture: >=100000 — AB

## 2016-11-04 ENCOUNTER — Telehealth (HOSPITAL_COMMUNITY): Payer: Self-pay | Admitting: Emergency Medicine

## 2016-11-04 NOTE — Telephone Encounter (Addendum)
LM daughter Hermida Raines/caregiver) on [phone] Notified of recent lab results from visit 10/31/16 Reports feeling better and sx have subsided but has now developed diarrhea.  Per Ruta Hinds, PA... Pt is to push fluids and take a probiotic  Pt is taking/tolarating well meds given at visit.  Adv pt if sx are not getting better to return or to f/u w/PCP Pt verb understanding.

## 2016-11-04 NOTE — Telephone Encounter (Signed)
-----   Message from Melony Overly, MD sent at 11/03/2016  1:04 PM EST ----- Sensitive to cipro.  Continue antibiotics as prescribed. [place]

## 2018-04-30 ENCOUNTER — Encounter: Payer: Self-pay | Admitting: Adult Health

## 2018-04-30 ENCOUNTER — Ambulatory Visit: Payer: Medicare Other | Admitting: Adult Health

## 2018-04-30 VITALS — BP 122/70 | Temp 98.5°F | Wt 116.0 lb

## 2018-04-30 DIAGNOSIS — Z1231 Encounter for screening mammogram for malignant neoplasm of breast: Secondary | ICD-10-CM

## 2018-04-30 DIAGNOSIS — F329 Major depressive disorder, single episode, unspecified: Secondary | ICD-10-CM

## 2018-04-30 DIAGNOSIS — R413 Other amnesia: Secondary | ICD-10-CM

## 2018-04-30 DIAGNOSIS — E7800 Pure hypercholesterolemia, unspecified: Secondary | ICD-10-CM | POA: Insufficient documentation

## 2018-04-30 DIAGNOSIS — J449 Chronic obstructive pulmonary disease, unspecified: Secondary | ICD-10-CM

## 2018-04-30 DIAGNOSIS — Z7689 Persons encountering health services in other specified circumstances: Secondary | ICD-10-CM

## 2018-04-30 DIAGNOSIS — G35 Multiple sclerosis: Secondary | ICD-10-CM

## 2018-04-30 DIAGNOSIS — C3490 Malignant neoplasm of unspecified part of unspecified bronchus or lung: Secondary | ICD-10-CM | POA: Insufficient documentation

## 2018-04-30 DIAGNOSIS — F32A Depression, unspecified: Secondary | ICD-10-CM

## 2018-04-30 NOTE — Progress Notes (Signed)
Patient presents to clinic today to establish care. She is a pleasant 77 year old female who  has a past medical history of Abnormality of gait (05/27/2013), Cancer ([place]), COPD (chronic obstructive pulmonary disease) ([place]), Depression, Diplopia, Epilepsy ([place]), Hearing deficit, High cholesterol, Memory deficit (05/27/2013), Multiple sclerosis ([place]), Murmur, Scoliosis, and Syncope.   She presents with her daughter who is her main caregiver.    Acute Concerns: Establish Care   Chronic Issues: Hyperlipidemia - Has taken statin in the past but is not currently taking medication now.   COPD - Was followed by Pulmonary at [facility]. She is on continuous nasal oxygen at 2 liters per minute. Her last visit was in 2017. She does not wear her oxygen on a regular basis. Continues to smoke about half pack a day. She was previously prescribed Albuterol inhaler and Anoro Ellipta, but stopped this on her own. Does not want to go back on any inhalers currently. Needs new referral to Pulmonary in [place]  MS - Was diagnosed in 1991 with Secondary Progressive  - She was receiving treatment at [facility] and [facility]. Treatment over the years included interferon treatment for an extended period of time but was discontinued due to being ineffective? Needs new referral to Neuro in [place]  Recurrent non small cell lung cancer - Has been treated twice in the past. Her last radiation treatment was in July 2016 at [facility]. She had her 6 month follow up but failed to do the one year follow up as her oncologist left the practice. Her last Pet scan was in 2016 or 2017.   Cognitive Impairment - Family reports a progressive decline over the last 18 months but more noticeable over the last 3-6 months. Family reports that she will often forget simple instructions and has trouble remembering words. The patient reports that she feels as though her memory has been in a progressively decline as well, especially with word finding issues.   Depression - is being  seen by psychiatry every three months. Currently takes Effexor 300 mg, Trazodone 150 mg, and Klonopin 0.5 mg and 1 mg at night - feels controlled.   Health Maintenance: Dental -- Routine Care  Vision -- Last was two years ago  Immunizations -- UTD  Colonoscopy -- Reports having two in the past. The last being over 10 years ago. She refuses to have another one.  Mammogram -- " 10 years ago" She is interested in having another one.  PAP -- No longer needed    Past Medical History:  Diagnosis Date  . Abnormality of gait 05/27/2013  . Cancer ([place])   . COPD (chronic obstructive pulmonary disease) ([place])   . Depression   . Diplopia    Monocular, OS  . Epilepsy ([place])   . Hearing deficit   . High cholesterol   . Memory deficit 05/27/2013  . Multiple sclerosis ([place])   . Murmur   . Scoliosis   . Syncope     Past Surgical History:  Procedure Laterality Date  . ABDOMINAL HYSTERECTOMY    . APPENDECTOMY  1952  . CESAREAN SECTION  1980  . CHOLECYSTECTOMY  2006  . OVARIAN CYST REMOVAL  2008    Current Outpatient Medications on File Prior to Visit  Medication Sig Dispense Refill  . acetaminophen (TYLENOL) 500 MG tablet Take 1,500 mg by mouth as needed for pain (up to 4,000 mg).    . clonazePAM (KLONOPIN) 0.5 MG tablet Take 1 tablet (0.5 mg total) by mouth  daily. 14 tablet 0  . clonazePAM (KLONOPIN) 1 MG tablet Take 1 tablet (1 mg total) by mouth 2 (two) times daily. 28 tablet 0  . traZODone (DESYREL) 150 MG tablet Take 150 mg by mouth at bedtime.    Marland Kitchen venlafaxine XR (EFFEXOR-XR) 150 MG 24 hr capsule Take 300 mg by mouth daily.     No current facility-administered medications on file prior to visit.     Allergies  Allergen Reactions  . Dilantin [Phenytoin] Hives  . Lactose Intolerance (Gi) Diarrhea  . Sulfa Antibiotics Hives    Family History  Problem Relation Age of Onset  . Stroke Mother   . Heart disease Mother   . Breast cancer Mother   . Pancreatitis Mother   . Leukemia  Father   . Heart disease Father   . Pancreatitis Sister   . Heart disease Brother   . Dementia Brother   . Heart disease Maternal Uncle   . Diverticulitis Paternal Aunt     Social History   Socioeconomic History  . Marital status: Divorced    Spouse name: Not on file  . Number of children: 1  . Years of education: 43  . Highest education level: Not on file  Occupational History    Employer: RETIRED  Social Needs  . Financial resource strain: Not on file  . Food insecurity:    Worry: Not on file    Inability: Not on file  . Transportation needs:    Medical: Not on file    Non-medical: Not on file  Tobacco Use  . Smoking status: Current Every Day Smoker    Packs/day: 1.00  . Smokeless tobacco: Never Used  Substance and Sexual Activity  . Alcohol use: No  . Drug use: No  . Sexual activity: Not on file  Lifestyle  . Physical activity:    Days per week: Not on file    Minutes per session: Not on file  . Stress: Not on file  Relationships  . Social connections:    Talks on phone: Not on file    Gets together: Not on file    Attends religious service: Not on file    Active member of club or organization: Not on file    Attends meetings of clubs or organizations: Not on file    Relationship status: Not on file  . Intimate partner violence:    Fear of current or ex partner: Not on file    Emotionally abused: Not on file    Physically abused: Not on file    Forced sexual activity: Not on file  Other Topics Concern  . Not on file  Social History Narrative  . Not on file    Review of Systems  Constitutional: Negative.   HENT: Positive for hearing loss and tinnitus.   Eyes: Positive for double vision (chronic ).  Respiratory: Positive for shortness of breath (chronic ).   Cardiovascular: Negative.   Gastrointestinal: Negative.   Genitourinary: Negative.   Musculoskeletal: Negative.   Skin: Negative.   Neurological: Positive for dizziness. Negative for tingling,  seizures, loss of consciousness and headaches.  Psychiatric/Behavioral: Positive for depression and memory loss. Negative for substance abuse and suicidal ideas. The patient is not nervous/anxious.   All other systems reviewed and are negative.   BP 122/70   Temp 98.5 F (36.9 C) (Oral)   Wt 116 lb (52.6 kg)   BMI 19.91 kg/m   Physical Exam  Constitutional: She is oriented to person,  place, and time. She appears well-developed. She appears cachectic. She appears ill. No distress.  Eyes: Pupils are equal, round, and reactive to light. Conjunctivae and EOM are normal.  Cardiovascular: Normal rate, regular rhythm, normal heart sounds and intact distal pulses. Exam reveals no gallop and no friction rub.  Pulmonary/Chest: Effort normal. She has wheezes (throughout ).  Musculoskeletal: Normal range of motion. She exhibits no edema, tenderness or deformity.  Walks with slow steady gait    Neurological: She is alert and oriented to person, place, and time.  Skin: Capillary refill takes less than 2 seconds. She is not diaphoretic.  Psychiatric: She has a normal mood and affect. Her behavior is normal. Judgment and thought content normal.  Nursing note and vitals reviewed.   Assessment/Plan: 1. High cholesterol - Consider statin  - I will have her follow up for CPE after she gets situated with her specialists   2. Chronic obstructive pulmonary disease, unspecified COPD type ([place])  - Ambulatory referral to Pulmonology  3. Multiple sclerosis ([place])  - Ambulatory referral to Neurology  4. Memory deficit  - Ambulatory referral to Neurology  5. Depression, unspecified depression type - Continue with POC by Psychiatry   6. Post-menopausal  - MM DIGITAL SCREENING BILATERAL; Future  7. Recurrent non-small cell lung cancer ([place])  - Ambulatory referral to Pulmonology   Dorothyann Peng, NP

## 2018-05-21 ENCOUNTER — Ambulatory Visit (HOSPITAL_BASED_OUTPATIENT_CLINIC_OR_DEPARTMENT_OTHER): Payer: Medicare Other

## 2018-06-05 ENCOUNTER — Institutional Professional Consult (permissible substitution): Payer: Medicare Other | Admitting: Pulmonary Disease

## 2018-06-08 ENCOUNTER — Ambulatory Visit (HOSPITAL_BASED_OUTPATIENT_CLINIC_OR_DEPARTMENT_OTHER)
Admission: RE | Admit: 2018-06-08 | Discharge: 2018-06-08 | Disposition: A | Discharge status: Home / Self Care | Payer: Medicare Other | Source: Ambulatory Visit | Attending: Adult Health | Admitting: Adult Health

## 2018-06-08 DIAGNOSIS — Z1231 Encounter for screening mammogram for malignant neoplasm of breast: Secondary | ICD-10-CM | POA: Diagnosis not present

## 2018-06-08 DIAGNOSIS — Z780 Asymptomatic menopausal state: Secondary | ICD-10-CM

## 2018-06-09 ENCOUNTER — Other Ambulatory Visit: Payer: Self-pay | Admitting: Adult Health

## 2018-06-09 DIAGNOSIS — R928 Other abnormal and inconclusive findings on diagnostic imaging of breast: Secondary | ICD-10-CM

## 2018-06-12 ENCOUNTER — Ambulatory Visit
Admission: RE | Admit: 2018-06-12 | Discharge: 2018-06-12 | Disposition: A | Discharge status: Home / Self Care | Payer: Medicare Other | Source: Ambulatory Visit | Attending: Adult Health | Admitting: Adult Health

## 2018-06-12 DIAGNOSIS — R928 Other abnormal and inconclusive findings on diagnostic imaging of breast: Secondary | ICD-10-CM

## 2018-06-15 ENCOUNTER — Ambulatory Visit (HOSPITAL_BASED_OUTPATIENT_CLINIC_OR_DEPARTMENT_OTHER): Payer: Medicare Other

## 2018-07-07 ENCOUNTER — Encounter (INDEPENDENT_AMBULATORY_CARE_PROVIDER_SITE_OTHER): Payer: Self-pay

## 2018-07-07 ENCOUNTER — Ambulatory Visit: Payer: Medicare Other | Admitting: Neurology

## 2018-07-07 ENCOUNTER — Telehealth: Payer: Self-pay | Admitting: Neurology

## 2018-07-07 ENCOUNTER — Encounter: Payer: Self-pay | Admitting: Neurology

## 2018-07-07 VITALS — BP 106/65 | HR 79 | Ht 63.0 in | Wt 118.0 lb

## 2018-07-07 DIAGNOSIS — R269 Unspecified abnormalities of gait and mobility: Secondary | ICD-10-CM

## 2018-07-07 DIAGNOSIS — Z5181 Encounter for therapeutic drug level monitoring: Secondary | ICD-10-CM

## 2018-07-07 DIAGNOSIS — G35 Multiple sclerosis: Secondary | ICD-10-CM

## 2018-07-07 DIAGNOSIS — E538 Deficiency of other specified B group vitamins: Secondary | ICD-10-CM

## 2018-07-07 DIAGNOSIS — R413 Other amnesia: Secondary | ICD-10-CM | POA: Diagnosis not present

## 2018-07-07 MED ORDER — DONEPEZIL HCL 5 MG PO TABS: 5.0000 mg | ORAL_TABLET | Freq: Every day | ORAL | 0 refills | Status: DC

## 2018-07-07 NOTE — Progress Notes (Signed)
Reason for visit: Multiple sclerosis, memory disorder  Referring physician: Dr. Laurence Compton is a 77 y.o. female  History of present illness:  Ms. Deschepper is a 77 year old right-handed white female with a history of multiple sclerosis diagnosed at age 55.  The patient has not been on any disease modifying agents in many years, she was followed by Dr. Gorden Harms and was felt to have a secondary progressive disease, she was taken off of Betaseron.  She has had some problems with gait instability, she reports no recent falls.  She does not use a cane or a walker for ambulation.  She comes in today for evaluation of her memory issues that have been present for about 4 or 5 years.  The patient was last seen through this office in July 2014.  The patient currently lives with her daughter, her daughter claims that she has not driven in over 4 years, mainly because of visual disturbances.  The patient is required assistance in paying her bills, keeping up with medications and appointments, and managing her finances for the last 4 years.  The memory issues have become much more prominent over the last 1 year.  The patient requires reminding to do activities of daily living such as taking a bath or shower.  The patient has a lot of underlying anxiety issues currently, she is on daily clonazepam.  The patient has had a decline in her ability to hear, she has begun hearing music when the environment is quiet.  She has not had any visual hallucinations but she does have some delusional thinking at times.  The patient reports some urgency with the bladder, she has decreased sensation of her bowels at times.  She is able to sleep with trazodone.  She does have migratory lancinating pains.  She does believe that her balance is worsening gradually over time.  The patient reports no new numbness or weakness of the extremities.  She is sent to this office for an evaluation.  She has not had a recent MRI of the  brain.  She continues to smoke cigarettes even though she has had lung cancer previously.  Past Medical History:  Diagnosis Date  . Abnormality of gait 05/27/2013  . Cancer ([place])   . COPD (chronic obstructive pulmonary disease) ([place])   . Depression   . Diplopia    Monocular, OS  . Epilepsy ([place])   . Hearing deficit   . High cholesterol   . Lung cancer ([place])   . Memory deficit 05/27/2013  . Multiple sclerosis ([place])   . Murmur   . Scoliosis   . Syncope   . Urine incontinence     Past Surgical History:  Procedure Laterality Date  . ABDOMINAL HYSTERECTOMY  2007  . APPENDECTOMY  1952  . CESAREAN SECTION  1980  . CHOLECYSTECTOMY  2006  . LUNG BIOPSY  2012  . OVARIAN CYST REMOVAL  2008    Family History  Problem Relation Age of Onset  . Stroke Mother   . Heart disease Mother   . Breast cancer Mother   . Pancreatitis Mother   . Alcohol abuse Mother   . Arthritis Mother   . Cancer Mother   . Hyperlipidemia Mother   . Mental illness Mother   . Leukemia Father   . Heart disease Father   . Alcohol abuse Father   . Arthritis Father   . Cancer Father   . Alcohol abuse Sister   . Arthritis  Sister   . COPD Sister   . Depression Sister   . Alcohol abuse Brother   . Diabetes Brother   . Kidney disease Brother   . Heart disease Maternal Uncle   . Diverticulitis Paternal Aunt   . Heart disease Brother   . Heart attack Brother   . Hypertension Brother   . Mental illness Brother   . Heart attack Brother   . Heart disease Brother   . Hypertension Brother   . Alcohol abuse Daughter   . Cancer Daughter   . COPD Daughter   . Drug abuse Daughter     Social history:  reports that she has been smoking. She has been smoking about 1.00 pack per day. She has never used smokeless tobacco. She reports that she does not drink alcohol or use drugs.  Medications:  Prior to Admission medications   Medication Sig Start Date End Date Taking? Authorizing Provider  acetaminophen  (TYLENOL) 500 MG tablet Take 1,500 mg by mouth as needed for pain (up to 4,000 mg).   Yes [provider]  clonazePAM (KLONOPIN) 0.5 MG tablet Take 1 tablet (0.5 mg total) by mouth daily. 12/17/14  Yes Tanna Furry, MD  clonazePAM (KLONOPIN) 1 MG tablet Take 1 tablet (1 mg total) by mouth 2 (two) times daily. Patient taking differently: Take 1 mg by mouth at bedtime.  12/17/14  Yes Tanna Furry, MD  traZODone (DESYREL) 100 MG tablet Take 100 mg by mouth at bedtime.    Yes [provider]  venlafaxine XR (EFFEXOR-XR) 75 MG 24 hr capsule Take by mouth 3 (three) times daily.    Yes [provider]      Allergies  Allergen Reactions  . Dilantin [Phenytoin] Hives  . Lactose Intolerance (Gi) Diarrhea  . Sulfa Antibiotics Hives    ROS:  Out of a complete 14 system review of symptoms, the patient complains only of the following symptoms, and all other reviewed systems are negative.  Weight loss, fatigue Hearing loss, difficulty swallowing Blurred vision, double vision Shortness of breath, wheezing Easy bruising Feeling cold Joint pain, aching muscles Allergies Depression, too much sleep, decreased energy, change in appetite, disinterest in activities  Blood pressure 106/65, pulse 79, height 5\' 3"  (1.6 m), weight 118 lb (53.5 kg).  Physical Exam  General: The patient is alert and cooperative at the time of the examination.  Eyes: Pupils are equal, round, and reactive to light. Discs are flat bilaterally.  Neck: The neck is supple, no carotid bruits are noted.  Respiratory: The respiratory examination is clear.  Cardiovascular: The cardiovascular examination reveals a regular rate and rhythm, no obvious murmurs or rubs are noted.  Skin: Extremities are without significant edema.  Neurologic Exam  Mental status: The patient is alert and oriented x 3 at the time of the examination. The Mini-Mental status examination done today shows a total score 28/30.  The  patient is able to name 18 animals in 1 minute..  Cranial nerves: Facial symmetry is not present.  There is slight ptosis of the left eye.  There is good sensation of the face to pinprick and soft touch bilaterally. The strength of the facial muscles and the muscles to head turning and shoulder shrug are normal bilaterally. Speech is well enunciated, no aphasia or dysarthria is noted. Extraocular movements are full. Visual fields are full. The tongue is midline, and the patient has symmetric elevation of the soft palate. No obvious hearing deficits are noted.  Motor: The  motor testing reveals 5 over 5 strength of all 4 extremities. Good symmetric motor tone is noted throughout.  Sensory: Sensory testing is intact to pinprick, soft touch, vibration sensation, and position sense on all 4 extremities. No evidence of extinction is noted.  Coordination: Cerebellar testing reveals good finger-nose-finger bilaterally.  There is some apraxia with use of the left arm.  With the lower extremities, the patient is able to perform heel shin bilaterally, but there is slight ataxia with the right leg.  Gait and station: Gait is wide-based, unsteady.  Tandem gait is unsteady.  Romberg is negative but is slightly unsteady.  Reflexes: Deep tendon reflexes are symmetric, but slightly brisk in the legs bilaterally. Toes are downgoing bilaterally.   MRI brain 02/04/13:  IMPRESSION:  1.  Moderate white matter changes as described. The finding is nonspecific but can be seen in the setting of chronic microvascular ischemia, a demyelinating process such as multiple sclerosis, vasculitis, complicated migraine headaches, or as the sequelae of a prior infectious or inflammatory process.  The nonspecific, the pattern of callosal septal involvement is typical of a pneumonia process such as multiple sclerosis.  There is no enhancement or restricted diffusion to suggest acute demyelination. 2.  No acute intracranial  abnormality.  * MRI scan images were reviewed online. I agree with the written report.    Assessment/Plan:  1.  Multiple sclerosis  2.  Progressive memory disturbance  3.  Progressive gait disturbance  The patient is at risk for dementia given her history of multiple sclerosis but she also has risk factors for cerebrovascular disease.  The patient continues to abuse tobacco.  The patient will undergo blood work today.  She will be set up for MRI of the brain.  She will be placed on Aricept.  She will follow-up in 6 months.  Jill Alexanders MD 07/07/2018 9:45 AM  [facility] [address] [place], [place] [address]  Phone [phone] Fax [phone]

## 2018-07-07 NOTE — Patient Instructions (Signed)
   We will start Aricept for the memory disturbance.  Begin Aricept (donepezil) at 5 mg at night for one month. If this medication is well-tolerated, please call our office and we will call in a prescription for the 10 mg tablets. Look out for side effects that may include nausea, diarrhea, weight loss, or stomach cramps. This medication will also cause a runny nose, therefore there is no need for allergy medications for this purpose.

## 2018-07-07 NOTE — Telephone Encounter (Signed)
[employer] Medicare order sent to GI. No auth they will reach out to the pt to schedule.  °

## 2018-07-08 ENCOUNTER — Telehealth: Payer: Self-pay

## 2018-07-08 LAB — COMPREHENSIVE METABOLIC PANEL
A/G RATIO: 2.3 — AB (ref 1.2–2.2)
ALK PHOS: 108 IU/L (ref 39–117)
ALT: 7 IU/L (ref 0–32)
AST: 13 IU/L (ref 0–40)
Albumin: 4.2 g/dL (ref 3.5–4.8)
BUN/Creatinine Ratio: 18 (ref 12–28)
BUN: 16 mg/dL (ref 8–27)
Bilirubin Total: 0.3 mg/dL (ref 0.0–1.2)
CALCIUM: 9.2 mg/dL (ref 8.7–10.3)
CO2: 26 mmol/L (ref 20–29)
Chloride: 101 mmol/L (ref 96–106)
Creatinine, Ser: 0.87 mg/dL (ref 0.57–1.00)
GFR calc Af Amer: 75 mL/min/{1.73_m2} (ref >59)
GFR, EST NON AFRICAN AMERICAN: 65 mL/min/{1.73_m2} (ref >59)
GLOBULIN, TOTAL: 1.8 g/dL (ref 1.5–4.5)
Glucose: 111 mg/dL — ABNORMAL HIGH (ref 65–99)
POTASSIUM: 4.4 mmol/L (ref 3.5–5.2)
SODIUM: 142 mmol/L (ref 134–144)
Total Protein: 6 g/dL (ref 6.0–8.5)

## 2018-07-08 LAB — SEDIMENTATION RATE: SED RATE: 2 mm/h (ref 0–40)

## 2018-07-08 LAB — HIV ANTIBODY (ROUTINE TESTING W REFLEX): HIV Screen 4th Generation wRfx: NONREACTIVE

## 2018-07-08 LAB — RPR: RPR: NONREACTIVE

## 2018-07-08 LAB — VITAMIN B12: Vitamin B-12: 248 pg/mL (ref 232–1245)

## 2018-07-08 NOTE — Telephone Encounter (Signed)
-----   Message from Kathrynn Ducking, MD sent at 07/08/2018  9:59 AM EDT -----  The blood work results are unremarkable. Please call the patient.  ----- Message ----- From: Lavone Neri Lab Results In Sent: 07/08/2018   7:39 AM EDT To: Kathrynn Ducking, MD

## 2018-07-08 NOTE — Telephone Encounter (Signed)
I called patient and left a detailed message with normal lab results, ok per dpr. I advised patient to call with any questions or concerns.

## 2018-07-17 ENCOUNTER — Telehealth: Payer: Self-pay | Admitting: Neurology

## 2018-07-17 NOTE — Telephone Encounter (Signed)
Pt's daughter Polivka said she started donepezil (ARICEPT) 5 MG tablet on Sunday, for the past 3 days her vision is more blurry, legs very weak, just feels 'weird". Could this be due to the medication or could it be MS flare up. Please call to advise today.

## 2018-07-17 NOTE — Telephone Encounter (Signed)
I called the daughter.  The patient has had side effects on the Aricept feeling strange, weak in the legs.  The may try cutting the 5 mg tablet in half and see if she can take this for a week and then go back to the 5 mg dosing.  If she cannot tolerate it, we will need to use another medication such as Namenda.

## 2018-07-23 ENCOUNTER — Inpatient Hospital Stay: Admission: RE | Admit: 2018-07-23 | Payer: Medicare Other | Source: Ambulatory Visit

## 2018-08-03 ENCOUNTER — Institutional Professional Consult (permissible substitution): Payer: Medicare Other | Admitting: Pulmonary Disease

## 2018-08-04 ENCOUNTER — Ambulatory Visit
Admission: RE | Admit: 2018-08-04 | Discharge: 2018-08-04 | Disposition: A | Discharge status: Home / Self Care | Payer: Medicare Other | Source: Ambulatory Visit | Attending: Neurology | Admitting: Neurology

## 2018-08-04 DIAGNOSIS — G35 Multiple sclerosis: Secondary | ICD-10-CM

## 2018-08-04 DIAGNOSIS — R413 Other amnesia: Secondary | ICD-10-CM

## 2018-08-04 MED ORDER — GADOBENATE DIMEGLUMINE 529 MG/ML IV SOLN
10.0000 mL | Freq: Once | INTRAVENOUS | Status: AC | PRN
  Administered 2018-08-04: 10 mL via INTRAVENOUS

## 2018-08-06 ENCOUNTER — Telehealth: Payer: Self-pay | Admitting: Neurology

## 2018-08-06 NOTE — Telephone Encounter (Signed)
  I called the patient.  The patient appears to have had some progression of white matter changes on MRI from 2014, the patient does have risk factors for cerebrovascular disease, also has a history of multiple sclerosis.  She is to go on low-dose aspirin if she can take aspirin.  The patient will be followed for her memory issues.  MRI brain 08/06/18:  IMPRESSION:   MRI brain (with and without) demonstrating: - Scattered periventricular and subcortical and posterior callosal foci of non-specific T2 hyperintensities. - No acute findings. - Compared to MRI on 02/04/2013, there has been some progression of white matter changes and atrophy.

## 2018-08-07 NOTE — Telephone Encounter (Signed)
Pt's daughter [place] has questions she would like to discuss with Dr Jannifer [place] regarding the MRI. She can be reached at [phone]

## 2018-08-07 NOTE — Telephone Encounter (Signed)
I called the patient, talk with the daughter.  The MRI of the brain does show some white matter changes that have progressed over the last 5 years, the patient is to go on low-dose aspirin, she is to stop smoking if possible.  The patient is on low-dose Aricept, she seems to be tolerating the 5 mg dose okay at this point.

## 2018-09-01 ENCOUNTER — Telehealth: Payer: Self-pay | Admitting: Neurology

## 2018-09-01 DIAGNOSIS — R269 Unspecified abnormalities of gait and mobility: Secondary | ICD-10-CM

## 2018-09-01 DIAGNOSIS — R29898 Other symptoms and signs involving the musculoskeletal system: Secondary | ICD-10-CM

## 2018-09-01 DIAGNOSIS — R413 Other amnesia: Secondary | ICD-10-CM

## 2018-09-01 NOTE — Telephone Encounter (Signed)
Pt's daughter/Brittyn-DPR request a script for a manual wheelchair to be sent to [facility] on [address]. She said her mother is having difficulty with walking with MS. She also said she is getting married in 2 weeks and feels this would help her mother with increased walking during that time. Please call when script has been sent

## 2018-09-01 NOTE — Telephone Encounter (Signed)
Wheelchair prescription signed, faxed and confirmed to [facility] at [phone].  Her daughter has been notified.

## 2018-09-01 NOTE — Telephone Encounter (Signed)
Pt's daughter called, she said [place] won't file the insurance. She is wanting script sent to [facility]  (p) [phone]

## 2018-09-01 NOTE — Telephone Encounter (Signed)
W/C order awaiting [facility] sig/fim

## 2018-09-07 NOTE — Telephone Encounter (Signed)
Pts daughter called stating that [facility] is needing clinic reason for the pts need of her wheelchair, valid orders, and the last face to face visit. Neville left a direct # to the referral line with [facility] [phone] ext [phone]. Verdis Frederickson requesting a call once everything has been sent.

## 2018-09-07 NOTE — Telephone Encounter (Signed)
Spoke with Oran Rein with [facility], got this response: "[name]  We received a rx from Dr. Jannifer [place] on 10/8 for a manual wheelchair. In order to provide and file to insurance for a chair we need updated order to include specific type of chair with safety accessories and a medical necessity form completed. "  Angie said that a form had been faxed over but I had not seen it so I asked that she refax the form to [phone].

## 2018-09-08 NOTE — Telephone Encounter (Signed)
Received form. Gave to Dr. Jannifer [place] to review, complete

## 2018-09-09 NOTE — Telephone Encounter (Signed)
Faxed completed/signed form to [facility]. Fax: [phone]. Received fax confirmation.

## 2018-09-09 NOTE — Telephone Encounter (Signed)
The patient is requesting a wheelchair, I called the patient, talk with the daughter.  The patient has good days and bad days with a walking, she may have severe fatigue and difficulty with ambulation at times.  She requires some assistance with bathing and dressing.  The form for the wheelchair will be filled out.

## 2018-09-28 ENCOUNTER — Encounter: Payer: Self-pay | Admitting: Pulmonary Disease

## 2018-09-28 ENCOUNTER — Ambulatory Visit: Payer: Medicare Other | Admitting: Pulmonary Disease

## 2018-09-28 VITALS — BP 98/68 | HR 61 | Ht 63.0 in | Wt 122.0 lb

## 2018-09-28 DIAGNOSIS — J9611 Chronic respiratory failure with hypoxia: Secondary | ICD-10-CM | POA: Diagnosis not present

## 2018-09-28 DIAGNOSIS — C3492 Malignant neoplasm of unspecified part of left bronchus or lung: Secondary | ICD-10-CM

## 2018-09-28 DIAGNOSIS — J432 Centrilobular emphysema: Secondary | ICD-10-CM

## 2018-09-28 DIAGNOSIS — Z720 Tobacco use: Secondary | ICD-10-CM

## 2018-09-28 DIAGNOSIS — Z23 Encounter for immunization: Secondary | ICD-10-CM

## 2018-09-28 MED ORDER — ALBUTEROL SULFATE HFA 108 (90 BASE) MCG/ACT IN AERS
2.0000 | INHALATION_SPRAY | Freq: Four times a day (QID) | RESPIRATORY_TRACT | 5 refills | Status: DC | PRN
Start: 2018-09-28 — End: 2020-03-28

## 2018-09-28 NOTE — Progress Notes (Signed)
   Subjective:    Patient ID: [name], female    DOB: March 17, 1941, 77 y.o.   MRN: [id number]  HPI    Review of Systems  Constitutional: Negative for fever and unexpected weight change.  HENT: Positive for dental problem and trouble swallowing. Negative for congestion, ear pain, nosebleeds, postnasal drip, rhinorrhea, sinus pressure, sneezing and sore throat.   Eyes: Negative for redness and itching.  Respiratory: Positive for shortness of breath and wheezing. Negative for cough and chest tightness.   Cardiovascular: Negative for palpitations and leg swelling.  Gastrointestinal: Positive for constipation and diarrhea. Negative for nausea and vomiting.  Genitourinary: Negative for dysuria.  Musculoskeletal: Negative for joint swelling.  Skin: Negative for rash.  Allergic/Immunologic: Negative.  Negative for environmental allergies, food allergies and immunocompromised state.  Neurological: Negative for headaches.  Hematological: Bruises/bleeds easily.  Psychiatric/Behavioral: Positive for dysphoric mood. The patient is nervous/anxious.        Objective:   Physical Exam        Assessment & Plan:

## 2018-09-28 NOTE — Patient Instructions (Addendum)
Will schedule CT chest Will arrange for 6 minute walk test to assess oxygen needs during the day Will arrange for overnight oxygen test to determine if you need supplemental oxygen at night Proair two puffs every 6 hours as needed for cough, wheeze, chest congestion, or shortness of breath High dose flu shot today Follow up in 6 months

## 2018-09-28 NOTE — Progress Notes (Signed)
[name], [name], [name]  Chief Complaint  Patient presents with  . pulm consult    Pt referred by Dr. Dorothyann Peng MD. Pt needs to be re-established in [place], not with [facility] anymore. Pt is requesting flu shot today, Stephanie possible pna shot.    Constitutional:  BP 98/68 (BP Location: Left Arm, Cuff Size: Normal)   Pulse 61   Ht 5\' 3"  (1.6 m)   Wt 122 lb (55.3 kg)   SpO2 95%   BMI 21.61 kg/m   Past Medical History:  Multiple sclerosis, HLD, Patent foramen ovale, Depression, Epilepsy, Scoliosis  Brief Summary:  [name] is a 77 y.o. 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  She was seen by Bush at [facility].  Moving all Bush to [place].  She still smokes 1 cigarette per day.  Planning to quit soon.    Has oxygen at night.  Was using oxygen during the day, but not recently.  Cat chew through tubing.  Was using 2 liters during the day Stephanie 4 liters at night.  Needs new DME.  Gets winded after walking about 250 feet.  Also short of breath in humid/hot or cold/windy weather.  Not having cough, wheeze, sputum, chest pain, fever, gland swelling, leg swelling, or hemoptysis.    Was using stiolto.  Didn't seem to help.  She is not on any inhalers at present.    Physical Exam:   Appearance - well kempt, thin  ENMT - clear nasal mucosa, midline nasal  septum, no oral exudates, no LAN, trachea midline, decreased hearing  Respiratory - normal chest wall, normal respiratory effort, no accessory muscle use, decreased breath sounds, no wheeze/rales  CV - s1s2 regular rate Stephanie rhythm, no murmurs, no peripheral edema, radial pulses symmetric  GI - soft, non tender, no masses  Lymph - no adenopathy noted in neck Stephanie axillary areas  MSK - normal gait  Ext - no cyanosis, clubbing, or joint inflammation noted  Skin - no rashes, lesions, or ulcers  Neuro - normal strength, oriented x 3  Psych -  normal mood Stephanie affect   Assessment/Plan:   COPD with emphysema. - will try her on prn proair for now - high dose flu shot today  Tobacco abuse. - reviewed options to assist with smoking cessation - encouraged her to keep up with smoking cessation efforts  Chronic hypoxic respiratory failure. 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 - repeat CT chest w/o contrast Stephanie then determine if she needs additional f/u with oncology  Multiple sclerosis. - she has switched neurologists to Dr. Jannifer [place] with [facility]    Patient Instructions  Will schedule CT chest Will arrange for 6 minute walk test to assess oxygen needs during the day Will arrange for overnight oxygen test to determine if you need supplemental oxygen at night Proair two puffs every 6 hours as needed for cough, wheeze, chest congestion, or shortness of breath High dose flu shot today Follow up in 6 months    Chesley Mires, MD [place] Pager: [phone] 09/28/2018, 10:27 AM  Flow Sheet     Bush tests:  CT chest 07/11/11 >> centrilobular emphysema PFT 10/10/11 >> FEV1  1.96 ((79%), FEV1% 60, TLC 5.45 (107.8%), DLCO 66% CT chest 08/04/15 >> radiation fibrosis LLL, advanced centrilobular emphysema  Sleep tests:  PSG 08/12/12 >> AHI 3   Review of Systems:  Constitutional: Negative for fever Stephanie unexpected weight change.  HENT: Positive for dental problem Stephanie trouble swallowing. Negative for congestion, ear pain, nosebleeds, postnasal drip, rhinorrhea, sinus pressure, sneezing Stephanie sore throat.   Eyes: Negative for redness Stephanie itching.  Respiratory: Positive for shortness of breath Stephanie wheezing. Negative for cough Stephanie chest tightness.   Cardiovascular: Negative for palpitations Stephanie leg  swelling.  Gastrointestinal: Positive for constipation Stephanie diarrhea. Negative for nausea Stephanie vomiting.  Genitourinary: Negative for dysuria.  Musculoskeletal: Negative for joint swelling.  Skin: Negative for rash.  Allergic/Immunologic: Negative.  Negative for environmental allergies, food allergies Stephanie immunocompromised state.  Neurological: Negative for headaches.  Hematological: Bruises/bleeds easily.  Psychiatric/Behavioral: Positive for dysphoric mood. The patient is nervous/anxious.    Medications:   Allergies as of 09/28/2018      Reactions   Dilantin [phenytoin] Hives   Lactose Intolerance (gi) Diarrhea   Sulfa Antibiotics Hives      Medication List        Accurate as of 09/28/18 10:27 AM. Always use your most recent med list.          acetaminophen 500 MG tablet Commonly known as:  TYLENOL Take 1,500 mg by mouth as needed for pain (up to 4,000 mg).   albuterol 108 (90 Base) MCG/ACT inhaler Commonly known as:  PROVENTIL HFA;VENTOLIN HFA Inhale 2 puffs into the lungs every 6 (six) hours as needed for wheezing or shortness of breath.   clonazePAM 0.5 MG tablet Commonly known as:  KLONOPIN Take 1 tablet (0.5 mg total) by mouth daily.   clonazePAM 1 MG tablet Commonly known as:  KLONOPIN Take 1 tablet (1 mg total) by mouth 2 (two) times daily.   donepezil 5 MG tablet Commonly known as:  ARICEPT Take 1 tablet (5 mg total) by mouth at bedtime.   traZODone 100 MG tablet Commonly known as:  DESYREL Take 100 mg by mouth at bedtime.   venlafaxine XR 75 MG 24 hr capsule Commonly known as:  EFFEXOR-XR Take by mouth 3 (three) times daily.       Past Surgical History:  She  has a past surgical history that includes Abdominal hysterectomy (2007); Ovarian cyst removal (2008); Cholecystectomy (2006); Cesarean section (1980); Appendectomy (1952); Stephanie Lung biopsy (2012).  Family History:  Her family history includes Alcohol abuse in her brother, daughter, father,  mother, Stephanie sister; Arthritis in her father, mother, Stephanie sister; Breast cancer in her mother; COPD in her daughter Stephanie sister; Cancer in her daughter, father, Stephanie mother; Depression in her sister; Diabetes in her brother; Diverticulitis in her paternal aunt; Drug abuse in her daughter; Heart attack in her brother Stephanie brother; Heart disease in her brother, brother, father, maternal uncle, Stephanie mother; Hyperlipidemia in her mother; Hypertension in her brother Stephanie brother; Kidney disease in her brother; Leukemia in her father; Mental illness in her brother Stephanie mother; Pancreatitis in her mother; Stroke in her mother.  Social History:  She  reports that she has been smoking. She has been smoking about 0.25 packs per day. She has never used smokeless tobacco. She reports that she does not drink alcohol or use drugs.

## 2018-10-07 ENCOUNTER — Ambulatory Visit: Payer: Medicare Other

## 2018-10-10 ENCOUNTER — Other Ambulatory Visit: Payer: Self-pay | Admitting: Neurology

## 2018-10-13 ENCOUNTER — Telehealth: Payer: Self-pay | Admitting: Pulmonary Disease

## 2018-10-13 NOTE — Telephone Encounter (Signed)
ONO with RA 10/08/18 >> test time 10 hrs 23 min.  Baseline SpO2 90%, low SpO2 84%.  Spent 1 hr 12 sec with SpO2 < 88%.   Please let her know that her oxygen level is still low at night.  She should continue using supplemental oxygen while asleep.

## 2018-10-15 ENCOUNTER — Ambulatory Visit: Payer: Medicare Other

## 2018-10-16 NOTE — Telephone Encounter (Signed)
Spoke with pt, aware of results/recs.  Nothing further needed.  

## 2018-10-16 NOTE — Telephone Encounter (Signed)
Patient returned call, CB is [phone]

## 2018-10-16 NOTE — Telephone Encounter (Signed)
lmtcb x1 for pt. 

## 2018-10-21 ENCOUNTER — Ambulatory Visit (INDEPENDENT_AMBULATORY_CARE_PROVIDER_SITE_OTHER)
Admission: RE | Admit: 2018-10-21 | Discharge: 2018-10-21 | Disposition: A | Discharge status: Home / Self Care | Payer: Medicare Other | Source: Ambulatory Visit | Attending: Pulmonary Disease | Admitting: Pulmonary Disease

## 2018-10-21 DIAGNOSIS — C3492 Malignant neoplasm of unspecified part of left bronchus or lung: Secondary | ICD-10-CM | POA: Diagnosis not present

## 2018-10-26 ENCOUNTER — Telehealth: Payer: Self-pay | Admitting: Pulmonary Disease

## 2018-10-26 NOTE — Telephone Encounter (Signed)
Called and spoke with patient, she is requesting results from CT scan.   VS please advise, thank you.

## 2018-10-27 NOTE — Telephone Encounter (Signed)
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

## 2018-10-27 NOTE — Telephone Encounter (Signed)
Daughter called back-patient will be here at [place] for 78mw test-would like to discuss Ct Chest results then.

## 2018-10-27 NOTE — Telephone Encounter (Signed)
Pt's daughter Ayris is calling back [phone]

## 2018-10-28 ENCOUNTER — Ambulatory Visit (INDEPENDENT_AMBULATORY_CARE_PROVIDER_SITE_OTHER): Payer: Medicare Other | Admitting: *Deleted

## 2018-10-28 DIAGNOSIS — R0602 Shortness of breath: Secondary | ICD-10-CM | POA: Diagnosis not present

## 2018-10-28 NOTE — Progress Notes (Signed)
SIX MIN WALK 10/28/2018 09/28/2018  Medications asa, effexor  -  Supplimental Oxygen during Test? (L/min) No No  Laps 9 -  Partial Lap (in Meters) 30 -  Baseline BP (sitting) 108/62 -  Baseline Heartrate 75 -  Baseline Dyspnea (Borg Scale) 4 -  Baseline Fatigue (Borg Scale) 5 -  Baseline SPO2 98 -  BP (sitting) 116/70 -  Heartrate 101 -  Dyspnea (Borg Scale) 6 -  Fatigue (Borg Scale) 7 -  SPO2 91 -  BP (sitting) 116/70 -  Heartrate 80 -  SPO2 95 -  Distance Completed 336 -  Tech Comments: - patient completed 3 laps, had some SOB.kmw

## 2018-10-28 NOTE — Telephone Encounter (Signed)
Pt had 79mw performed today, 10/28/18 at 9am.  Attempted to call pt's daughter Lundon to see if she was given the results of pt's CT while they were at the office but unable to reach her. Left message for Norah to return call.

## 2018-10-29 NOTE — Telephone Encounter (Signed)
Called and spoke with pt and pt's daughter [name] regarding if they rec'd results of CT scan. They verbalized they rec'd the results on 10-28-18 and had no further questions. Nothing further at this time.

## 2018-12-03 ENCOUNTER — Encounter: Payer: Self-pay | Admitting: Adult Health

## 2018-12-03 ENCOUNTER — Ambulatory Visit: Payer: Medicare Other | Admitting: Adult Health

## 2018-12-03 VITALS — BP 124/64 | Temp 98.8°F | Wt 124.0 lb

## 2018-12-03 DIAGNOSIS — D492 Neoplasm of unspecified behavior of bone, soft tissue, and skin: Secondary | ICD-10-CM | POA: Diagnosis not present

## 2018-12-03 NOTE — Progress Notes (Signed)
Subjective:    Patient ID: [name], female    DOB: 08-29-1941, 78 y.o.   MRN: [id number]  HPI  78 year old female who  has a past medical history of Abnormality of gait (05/27/2013), Cancer ([place]), COPD (chronic obstructive pulmonary disease) ([place]), Depression, Diplopia, Epilepsy ([place]), Hearing deficit, High cholesterol, Lung cancer ([place]), Memory deficit (05/27/2013), Multiple sclerosis ([place]), Murmur, Scoliosis, Syncope, and Urine incontinence.  She presents to the office today for wound on medial aspect of left ankle. She reports that the wound has been present for " years". Her daughter who is with her today is unsure of how long the neoplasm has been present. Feels as though the neoplasm has become larger and has started bleeding. She has pain with deep palpation   Review of Systems See HPI   Past Medical History:  Diagnosis Date  . Abnormality of gait 05/27/2013  . Cancer ([place])   . COPD (chronic obstructive pulmonary disease) ([place])   . Depression   . Diplopia    Monocular, OS  . Epilepsy ([place])   . Hearing deficit   . High cholesterol   . Lung cancer ([place])   . Memory deficit 05/27/2013  . Multiple sclerosis ([place])   . Murmur   . Scoliosis   . Syncope   . Urine incontinence     Social History   Socioeconomic History  . Marital status: Divorced    Spouse name: Not on file  . Number of children: 1  . Years of education: 31  . Highest education level: Master's degree (e.g., MA, MS, MEng, MEd, MSW, MBA)  Occupational History    Employer: RETIRED  Social Needs  . Financial resource strain: Not on file  . Food insecurity:    Worry: Not on file    Inability: Not on file  . Transportation needs:    Medical: Not on file    Non-medical: Not on file  Tobacco Use  . Smoking status: Current Every Day Smoker    Packs/day: 0.25  . Smokeless tobacco: Never Used  Substance and Sexual Activity  . Alcohol use: No  . Drug use: No  . Sexual activity: Not on file  Lifestyle   . Physical activity:    Days per week: Not on file    Minutes per session: Not on file  . Stress: Not on file  Relationships  . Social connections:    Talks on phone: Not on file    Gets together: Not on file    Attends religious service: Not on file    Active member of club or organization: Not on file    Attends meetings of clubs or organizations: Not on file    Relationship status: Not on file  . Intimate partner violence:    Fear of current or ex partner: Not on file    Emotionally abused: Not on file    Physically abused: Not on file    Forced sexual activity: Not on file  Other Topics Concern  . Not on file  Social History Narrative  . Not on file    Past Surgical History:  Procedure Laterality Date  . ABDOMINAL HYSTERECTOMY  2007  . APPENDECTOMY  1952  . CESAREAN SECTION  1980  . CHOLECYSTECTOMY  2006  . LUNG BIOPSY  2012  . OVARIAN CYST REMOVAL  2008    Family History  Problem Relation Age of Onset  . Stroke Mother   . Heart disease Mother   .  Breast cancer Mother   . Pancreatitis Mother   . Alcohol abuse Mother   . Arthritis Mother   . Cancer Mother   . Hyperlipidemia Mother   . Mental illness Mother   . Leukemia Father   . Heart disease Father   . Alcohol abuse Father   . Arthritis Father   . Cancer Father   . Alcohol abuse Sister   . Arthritis Sister   . COPD Sister   . Depression Sister   . Alcohol abuse Brother   . Diabetes Brother   . Kidney disease Brother   . Heart disease Maternal Uncle   . Diverticulitis Paternal Aunt   . Heart disease Brother   . Heart attack Brother   . Hypertension Brother   . Mental illness Brother   . Heart attack Brother   . Heart disease Brother   . Hypertension Brother   . Alcohol abuse Daughter   . Cancer Daughter   . COPD Daughter   . Drug abuse Daughter     Allergies  Allergen Reactions  . Dilantin [Phenytoin] Hives  . Lactose Intolerance (Gi) Diarrhea  . Shellfish Allergy Other (See Comments)     Childhood allergy--unknown reaction  . Sulfa Antibiotics Hives    Current Outpatient Medications on File Prior to Visit  Medication Sig Dispense Refill  . acetaminophen (TYLENOL) 500 MG tablet Take 1,500 mg by mouth as needed for pain (up to 4,000 mg).    Marland Kitchen albuterol (PROAIR HFA) 108 (90 Base) MCG/ACT inhaler Inhale 2 puffs into the lungs every 6 (six) hours as needed for wheezing or shortness of breath. 1 Inhaler 5  . clonazePAM (KLONOPIN) 0.5 MG tablet Take 1 tablet (0.5 mg total) by mouth daily. 14 tablet 0  . clonazePAM (KLONOPIN) 1 MG tablet Take 1 tablet (1 mg total) by mouth 2 (two) times daily. (Patient taking differently: Take 1 mg by mouth at bedtime. ) 28 tablet 0  . donepezil (ARICEPT) 5 MG tablet TAKE 1 TABLET BY MOUTH AT BEDTIME 30 tablet 0  . traZODone (DESYREL) 100 MG tablet Take 100 mg by mouth at bedtime.     Marland Kitchen venlafaxine XR (EFFEXOR-XR) 75 MG 24 hr capsule Take by mouth 3 (three) times daily.      No current facility-administered medications on file prior to visit.     BP 124/64   Temp 98.8 F (37.1 C)   Wt 124 lb (56.2 kg)   BMI 21.97 kg/m       Objective:   Physical Exam Vitals signs and nursing note reviewed.  Constitutional:      Appearance: Normal appearance.  Skin:    General: Skin is warm and dry.     Findings: Lesion present.     Comments: approx 1.3 cm in diameter raised neoplasm noted on medial aspect of left ankle. No active bleeding or discharge   Neurological:     Mental Status: She is alert.  Psychiatric:        Mood and Affect: Mood normal.        Behavior: Behavior normal.        Thought Content: Thought content normal.        Judgment: Judgment normal.       Assessment & Plan:  1. Skin neoplasm - Concern for melanoma. Will make urgent referral to skin surgery center  - Ambulatory referral to Dermatology   Dorothyann Peng, NP

## 2018-12-08 HISTORY — PX: OTHER SURGICAL HISTORY: SHX169

## 2018-12-30 ENCOUNTER — Other Ambulatory Visit: Payer: Self-pay | Admitting: Neurology

## 2019-01-19 ENCOUNTER — Ambulatory Visit: Payer: Medicare Other | Admitting: Neurology

## 2019-01-29 ENCOUNTER — Encounter: Payer: Self-pay | Admitting: Family Medicine

## 2019-02-01 ENCOUNTER — Telehealth: Payer: Self-pay

## 2019-02-01 NOTE — Telephone Encounter (Signed)
Pt is on wait list for an earlier visit with Dr. Jannifer [place]. I contacted the pt and left a vm advising Dr. Jannifer [place] had an opening for tomorrow (02/02/19) at 12 pm ( double book work in [place]). I advised pt to return my call if this appointment day and time worked for her schedule.

## 2019-06-11 ENCOUNTER — Telehealth: Payer: Self-pay | Admitting: Neurology

## 2019-06-11 NOTE — Telephone Encounter (Signed)
Pt daughter on DPR is asking for a call from RN to discuss if pt can have a virtual appointment instead of an in office appointment.  Please call

## 2019-06-14 NOTE — Telephone Encounter (Signed)
Noted, thank you

## 2019-06-14 NOTE — Telephone Encounter (Signed)
Pt's daughter called back and gave verbal consent to file as a Virtual Visit. Email given is [email or web address]. Link has been sent.

## 2019-06-14 NOTE — Telephone Encounter (Signed)
Left vm asking pt's daughter to call back. We can transition 06/23/2019 visit to a virtual. Once daughter calls back please obtain verbal consent and e-mail address for [email or web address] link.

## 2019-06-23 ENCOUNTER — Encounter: Payer: Self-pay | Admitting: Neurology

## 2019-06-23 ENCOUNTER — Telehealth: Payer: Self-pay | Admitting: Neurology

## 2019-06-23 ENCOUNTER — Encounter

## 2019-06-23 ENCOUNTER — Ambulatory Visit (INDEPENDENT_AMBULATORY_CARE_PROVIDER_SITE_OTHER): Payer: Medicare Other | Admitting: Neurology

## 2019-06-23 ENCOUNTER — Other Ambulatory Visit: Payer: Self-pay

## 2019-06-23 DIAGNOSIS — R413 Other amnesia: Secondary | ICD-10-CM | POA: Diagnosis not present

## 2019-06-23 DIAGNOSIS — G35 Multiple sclerosis: Secondary | ICD-10-CM | POA: Diagnosis not present

## 2019-06-23 DIAGNOSIS — R269 Unspecified abnormalities of gait and mobility: Secondary | ICD-10-CM

## 2019-06-23 NOTE — Progress Notes (Signed)
     Virtual Visit via Telephone Note  I connected with [name] on 06/23/19 at  8:00 AM EDT by telephone and verified that I am speaking with the correct person using two identifiers.  Location: Patient: The patient is at home. Provider: Physician in office.   I discussed the limitations, risks, security and privacy concerns of performing an evaluation and management service by telephone and the availability of in person appointments. I also discussed with the patient that there may be a patient responsible charge related to this service. The patient expressed understanding and agreed to proceed.   History of Present Illness: [name] is a 78 year old right-handed white female with a history of multiple sclerosis with a progressive form.  The patient had been on Betaseron in the past but she has not been on any disease modifying agents in quite some time.  The patient has developed a gait disorder, over the last 4 months she believes that this has worsened and has been associated with some tingling sensations in the feet at this point.  The patient has a history of lung cancer, she continues to abuse tobacco unfortunately.  The patient indicates that this issue has been stable.  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  She reports that she has not had any recent falls but her overall equilibrium has worsened.  She denies any burning sensations in the feet in the evening, but again has tingling in the feet and legs that is new for her.  She continues to have some troubles with memory, she is on low-dose Aricept at 5 mg daily, she was not able to tolerate higher doses.  She denies any problems tolerating the Aricept, she is not having diarrhea at this point.   Observations/Objective: The Doxy-Me video evaluation failed, this visit was converted to a telephone visit.  The patient appears to be alert and cooperative by the telephone interview, the patient  is answering questions appropriately.  A Moca-blind memory test was done, the patient scored 15/22.  Assessment and Plan: 1.  Multiple sclerosis, progressive  2.  Gait disturbance  3.  Memory disturbance  The patient reports some new issues with tingling in the legs and worsening balance.  The patient will be set up for physical therapy for gait training.  The patient will come in for nerve conduction study and EMG evaluation to ensure that the peripheral neuropathy is not at work.  Nerve conduction studies will be done on both legs, EMG on one leg.  The patient did have MRI of the brain done recently.  We may consider addition of a medication for progressive multiple sclerosis in the future if the nerve conduction studies are unremarkable.  She will otherwise follow-up in 6 months.  Follow Up Instructions: 49-month follow-up, may see nurse practitioner.   I discussed the assessment and treatment plan with the patient. The patient was provided an opportunity to ask questions and all were answered. The patient agreed with the plan and demonstrated an understanding of the instructions.   The patient was advised to call back or seek an in-person evaluation if the symptoms worsen or if the condition fails to improve as anticipated.  I provided 25 minutes of non-face-to-face time during this encounter.   Kathrynn Ducking, MD

## 2019-06-23 NOTE — Telephone Encounter (Signed)
I called patient today and spoke with her daughter, Henley. I scheduled the 6 month follow-up for patient and NCV/EMG study. Mckinlee inquired about the order for physical therapy. She states that the patient is considered high-risk for the virus and this concerns her. She is wondering if patient can be ordered at-home physical therapy. I advised pt daughter that I would pass this along to RN & Dr. Jannifer Franklin and we will be back in touch.

## 2019-06-23 NOTE — Telephone Encounter (Signed)
I called the patient.  I talk with the daughter.  Either way, physical therapy will carry some minor increase in risk of the COVID virus, home health physical therapy involved someone coming to their home who has been in other homes throughout the day or throughout the week.  I do not see that there is any significant benefit with doing home health therapy versus going to a facility for physical therapy, the facility will offer better rehab with better facilities.  We will stay with the current plans with physical therapy.

## 2019-08-11 ENCOUNTER — Encounter: Payer: Medicare Other | Admitting: Neurology

## 2019-09-15 ENCOUNTER — Encounter: Payer: Self-pay | Admitting: Neurology

## 2019-10-19 ENCOUNTER — Telehealth: Payer: Self-pay | Admitting: Neurology

## 2019-10-19 NOTE — Telephone Encounter (Signed)
I called pts daughter Matina on dpr. She stated Dr. [name] pts psychiatrist wants to prescribed pt abilify 2mg  daily for  her depression. He wanted to get Dr. Jannifer Franklin opinion because her mom is at risk of having a stroke because of MRi brain in 2019. I stated message will be sent to Dr. Jannifer Franklin for his recommendation. The daughter verbalized understanding.

## 2019-10-19 NOTE — Telephone Encounter (Signed)
Pt daughter(on DPR) states another of pt's Dr's would like to put pt on 2mg  of Abilify. Pt daughter wants a call to discuss

## 2019-10-19 NOTE — Telephone Encounter (Signed)
I called the daughter.  The patient has a history of multiple sclerosis, she has never had a stroke in the past.  Had a 2 mg dose of the Abilify, the risk of stroke would be very minimal, okay to use for treatment of depression.

## 2019-11-24 ENCOUNTER — Telehealth: Payer: Self-pay

## 2019-11-24 NOTE — Telephone Encounter (Signed)
Canceled pts appt. Provider is out of office, after testing positive for covid. Pts Son in law has Covid.

## 2019-12-01 ENCOUNTER — Encounter: Payer: Self-pay | Admitting: Neurology

## 2019-12-10 ENCOUNTER — Ambulatory Visit: Payer: Medicare Other | Attending: Internal Medicine

## 2019-12-10 DIAGNOSIS — Z23 Encounter for immunization: Secondary | ICD-10-CM | POA: Insufficient documentation

## 2019-12-10 NOTE — Progress Notes (Signed)
   Covid-19 Vaccination Clinic  Name:  VIVECA BECKSTROM    MRN: [id number] DOB: 12-04-1940  12/10/2019  Ms. Dolbow was observed post Covid-19 immunization for 15 minutes without incidence. She was provided with Vaccine Information Sheet and instruction to access the V-Safe system.   Ms. Zadrozny was instructed to call 911 with any severe reactions post vaccine: Marland Kitchen Difficulty breathing  . Swelling of your face and throat  . A fast heartbeat  . A bad rash all over your body  . Dizziness and weakness    Immunizations Administered    Name Date Dose VIS Date Route   Pfizer COVID-19 Vaccine 12/10/2019  9:24 AM 0.3 mL 11/05/2019 Intramuscular   Manufacturer: [place]   Lot: [id number]   [place]: [id number]

## 2019-12-29 ENCOUNTER — Ambulatory Visit: Payer: Medicare PPO | Attending: Internal Medicine

## 2019-12-29 ENCOUNTER — Ambulatory Visit: Payer: Medicare Other | Admitting: Neurology

## 2019-12-29 DIAGNOSIS — Z23 Encounter for immunization: Secondary | ICD-10-CM | POA: Insufficient documentation

## 2019-12-29 NOTE — Progress Notes (Signed)
   Covid-19 Vaccination Clinic  Name:  [name]    MRN: [id number] DOB: 03-17-1941  12/29/2019  [name] was observed post Covid-19 immunization for 15 minutes without incidence. She was provided with Vaccine Information Sheet and instruction to access the V-Safe system.   [name] was instructed to call 911 with any severe reactions post vaccine: Marland Kitchen Difficulty breathing  . Swelling of your face and throat  . A fast heartbeat  . A bad rash all over your body  . Dizziness and weakness    Immunizations Administered    Name Date Dose VIS Date Route   Pfizer COVID-19 Vaccine 12/29/2019 10:35 AM 0.3 mL 11/05/2019 Intramuscular   Manufacturer: [place]   Lot: [id number]   [place]: [id number]

## 2020-02-21 ENCOUNTER — Encounter: Payer: Self-pay | Admitting: Adult Health

## 2020-02-21 DIAGNOSIS — F3289 Other specified depressive episodes: Secondary | ICD-10-CM

## 2020-02-23 ENCOUNTER — Ambulatory Visit: Payer: Medicare Other | Admitting: Neurology

## 2020-03-21 ENCOUNTER — Encounter (HOSPITAL_COMMUNITY): Payer: Self-pay | Admitting: Emergency Medicine

## 2020-03-21 ENCOUNTER — Emergency Department (HOSPITAL_COMMUNITY)
Admission: EM | Admit: 2020-03-21 | Discharge: 2020-03-22 | Disposition: A | Discharge status: Home / Self Care | Payer: Medicare PPO | Attending: Emergency Medicine | Admitting: Emergency Medicine

## 2020-03-21 ENCOUNTER — Encounter: Payer: Self-pay | Admitting: Adult Health

## 2020-03-21 ENCOUNTER — Other Ambulatory Visit: Payer: Self-pay

## 2020-03-21 DIAGNOSIS — Z85118 Personal history of other malignant neoplasm of bronchus and lung: Secondary | ICD-10-CM | POA: Insufficient documentation

## 2020-03-21 DIAGNOSIS — G35 Multiple sclerosis: Secondary | ICD-10-CM | POA: Diagnosis not present

## 2020-03-21 DIAGNOSIS — F17200 Nicotine dependence, unspecified, uncomplicated: Secondary | ICD-10-CM | POA: Insufficient documentation

## 2020-03-21 DIAGNOSIS — F329 Major depressive disorder, single episode, unspecified: Secondary | ICD-10-CM | POA: Diagnosis not present

## 2020-03-21 DIAGNOSIS — F4321 Adjustment disorder with depressed mood: Secondary | ICD-10-CM

## 2020-03-21 DIAGNOSIS — Z85828 Personal history of other malignant neoplasm of skin: Secondary | ICD-10-CM | POA: Insufficient documentation

## 2020-03-21 DIAGNOSIS — R443 Hallucinations, unspecified: Secondary | ICD-10-CM | POA: Diagnosis present

## 2020-03-21 DIAGNOSIS — Z046 Encounter for general psychiatric examination, requested by authority: Secondary | ICD-10-CM | POA: Diagnosis not present

## 2020-03-21 DIAGNOSIS — F419 Anxiety disorder, unspecified: Secondary | ICD-10-CM | POA: Diagnosis not present

## 2020-03-21 DIAGNOSIS — F0390 Unspecified dementia without behavioral disturbance: Secondary | ICD-10-CM | POA: Diagnosis not present

## 2020-03-21 DIAGNOSIS — J449 Chronic obstructive pulmonary disease, unspecified: Secondary | ICD-10-CM | POA: Diagnosis not present

## 2020-03-21 DIAGNOSIS — Z79899 Other long term (current) drug therapy: Secondary | ICD-10-CM | POA: Insufficient documentation

## 2020-03-21 DIAGNOSIS — F449 Dissociative and conversion disorder, unspecified: Secondary | ICD-10-CM | POA: Diagnosis not present

## 2020-03-21 DIAGNOSIS — Z20822 Contact with and (suspected) exposure to covid-19: Secondary | ICD-10-CM | POA: Insufficient documentation

## 2020-03-21 LAB — COMPREHENSIVE METABOLIC PANEL
ALT: 11 U/L (ref 0–44)
AST: 15 U/L (ref 15–41)
Albumin: 3.8 g/dL (ref 3.5–5.0)
Alkaline Phosphatase: 89 U/L (ref 38–126)
Anion gap: 7 (ref 5–15)
BUN: 18 mg/dL (ref 8–23)
CO2: 26 mmol/L (ref 22–32)
Calcium: 8.4 mg/dL — ABNORMAL LOW (ref 8.9–10.3)
Chloride: 104 mmol/L (ref 98–111)
Creatinine, Ser: 0.76 mg/dL (ref 0.44–1.00)
GFR calc Af Amer: >60 mL/min (ref >60)
GFR calc non Af Amer: >60 mL/min (ref >60)
Glucose, Bld: 95 mg/dL (ref 70–99)
Potassium: 3.7 mmol/L (ref 3.5–5.1)
Sodium: 137 mmol/L (ref 135–145)
Total Bilirubin: 0.5 mg/dL (ref 0.3–1.2)
Total Protein: 6.7 g/dL (ref 6.5–8.1)

## 2020-03-21 LAB — URINALYSIS, ROUTINE W REFLEX MICROSCOPIC
Bilirubin Urine: NEGATIVE
Glucose, UA: NEGATIVE mg/dL
Hgb urine dipstick: NEGATIVE
Ketones, ur: NEGATIVE mg/dL
Leukocytes,Ua: NEGATIVE
Nitrite: NEGATIVE
Protein, ur: NEGATIVE mg/dL
Specific Gravity, Urine: 1.026 (ref 1.005–1.030)
pH: 5 (ref 5.0–8.0)

## 2020-03-21 LAB — CBC WITH DIFFERENTIAL/PLATELET
Abs Immature Granulocytes: 0.02 10*3/uL (ref 0.00–0.07)
Basophils Absolute: 0.1 10*3/uL (ref 0.0–0.1)
Basophils Relative: 1 %
Eosinophils Absolute: 0.1 10*3/uL (ref 0.0–0.5)
Eosinophils Relative: 1 %
HCT: 48.4 % — ABNORMAL HIGH (ref 36.0–46.0)
Hemoglobin: 15.5 g/dL — ABNORMAL HIGH (ref 12.0–15.0)
Immature Granulocytes: 0 %
Lymphocytes Relative: 22 %
Lymphs Abs: 2.3 10*3/uL (ref 0.7–4.0)
MCH: 32.8 pg (ref 26.0–34.0)
MCHC: 32 g/dL (ref 30.0–36.0)
MCV: 102.3 fL — ABNORMAL HIGH (ref 80.0–100.0)
Monocytes Absolute: 0.8 10*3/uL (ref 0.1–1.0)
Monocytes Relative: 8 %
Neutro Abs: 7.4 10*3/uL (ref 1.7–7.7)
Neutrophils Relative %: 68 %
Platelets: 263 10*3/uL (ref 150–400)
RBC: 4.73 MIL/uL (ref 3.87–5.11)
RDW: 12.5 % (ref 11.5–15.5)
WBC: 10.7 10*3/uL — ABNORMAL HIGH (ref 4.0–10.5)
nRBC: 0 % (ref 0.0–0.2)

## 2020-03-21 LAB — RAPID URINE DRUG SCREEN, HOSP PERFORMED
Amphetamines: NOT DETECTED
Barbiturates: NOT DETECTED
Benzodiazepines: NOT DETECTED
Cocaine: NOT DETECTED
Opiates: NOT DETECTED
Tetrahydrocannabinol: NOT DETECTED

## 2020-03-21 LAB — ETHANOL: Alcohol, Ethyl (B): <10 mg/dL (ref <10)

## 2020-03-21 LAB — RESPIRATORY PANEL BY RT PCR (FLU A&B, COVID)
Influenza A by PCR: NEGATIVE
Influenza B by PCR: NEGATIVE
SARS Coronavirus 2 by RT PCR: NEGATIVE

## 2020-03-21 MED ORDER — ACETAMINOPHEN 325 MG PO TABS: 650.0000 mg | ORAL_TABLET | ORAL | Status: DC | PRN

## 2020-03-21 MED ORDER — ONDANSETRON HCL 4 MG PO TABS: 4.0000 mg | ORAL_TABLET | Freq: Three times a day (TID) | ORAL | Status: DC | PRN

## 2020-03-21 MED ORDER — ACETAMINOPHEN 500 MG PO TABS: 500.0000 mg | ORAL_TABLET | Freq: Four times a day (QID) | ORAL | Status: DC | PRN

## 2020-03-21 MED ORDER — ALBUTEROL SULFATE HFA 108 (90 BASE) MCG/ACT IN AERS: 2.0000 | INHALATION_SPRAY | Freq: Four times a day (QID) | RESPIRATORY_TRACT | Status: DC | PRN

## 2020-03-21 NOTE — ED Notes (Signed)
Daughter leaving to go get something to eat

## 2020-03-21 NOTE — ED Notes (Signed)
Tele-psych assessment in place

## 2020-03-21 NOTE — ED Notes (Signed)
Provided pt with light snack and drink, pt resting comfortably

## 2020-03-21 NOTE — BHH Counselor (Signed)
Per Earleen Newport, NP inpatient geriatric psychiatry admission is recommended.  TTS will pursue inpatient treatment for patient.

## 2020-03-21 NOTE — ED Notes (Signed)
Patient resting comfortably in bed.

## 2020-03-21 NOTE — Progress Notes (Signed)
Patient meets inpatient criteria per Earleen Newport, NP. Patient has been faxed out to the following facilities for review:   [facility]  CSW will continue to follow and assist with securing bed placement.   Domenic Schwab, MSW, LCSW-A Clinical Disposition Social Worker [facility]/TTS [phone]

## 2020-03-21 NOTE — ED Provider Notes (Signed)
[facility] DEPT Provider Note   CSN: [id number] Arrival date & time: 03/21/20  1036     History No chief complaint on file.   [name] is a 79 y.o. female.  79 year old female with history of depression, dissociative disorder and dementia who presents with in creased hallucinations.  She denies any SI or HI.  About history is from the daughter who states that she had a visit by her psychiatrist today and felt she was going through a dissociative episode and required inpatient general psych placement.  Patient been on multiple medications but stopped taking about a month ago.  No recent medical issues.        Past Medical History:  Diagnosis Date  . Abnormality of gait 05/27/2013  . Cancer ([place])   . COPD (chronic obstructive pulmonary disease) ([place])   . Depression   . Diplopia    Monocular, OS  . Epilepsy ([place])   . Hearing deficit   . High cholesterol   . Lung cancer ([place])   . Memory deficit 05/27/2013  . Multiple sclerosis ([place])   . Murmur   . Scoliosis   . Syncope   . Urine incontinence     Patient Active Problem List   Diagnosis Date Noted  . Recurrent non-small cell lung cancer ([place]) 04/30/2018  . Abnormality of gait 05/27/2013  . Memory deficit 05/27/2013  . Leg weakness, bilateral 02/04/2013  . Multiple sclerosis ([place]) 02/04/2013  . COPD (chronic obstructive pulmonary disease) ([place]) 02/04/2013  . Depression 02/04/2013  . HLD (hyperlipidemia) 02/04/2013  . Anxiety 02/04/2013  . Insomnia 02/04/2013  . Hypokalemia 02/04/2013    Past Surgical History:  Procedure Laterality Date  . ABDOMINAL HYSTERECTOMY  2007  . APPENDECTOMY  1952  . CESAREAN SECTION  1980  . CHOLECYSTECTOMY  2006  . LUNG BIOPSY  2012  . OVARIAN CYST REMOVAL  2008  . Skin biopsy Left 12/08/2018   Superior Medial Anterior Ankle - Basal Cell Carcinoma, nodular pattern, ulcerated     OB History   No obstetric history on file.     Family History   Problem Relation Age of Onset  . Stroke Mother   . Heart disease Mother   . Breast cancer Mother   . Pancreatitis Mother   . Alcohol abuse Mother   . Arthritis Mother   . Cancer Mother   . Hyperlipidemia Mother   . Mental illness Mother   . Leukemia Father   . Heart disease Father   . Alcohol abuse Father   . Arthritis Father   . Cancer Father   . Alcohol abuse Sister   . Arthritis Sister   . COPD Sister   . Depression Sister   . Alcohol abuse Brother   . Diabetes Brother   . Kidney disease Brother   . Heart disease Maternal Uncle   . Diverticulitis Paternal Aunt   . Heart disease Brother   . Heart attack Brother   . Hypertension Brother   . Mental illness Brother   . Heart attack Brother   . Heart disease Brother   . Hypertension Brother   . Alcohol abuse Daughter   . Cancer Daughter   . COPD Daughter   . Drug abuse Daughter     Social History   Tobacco Use  . Smoking status: Current Every Day Smoker    Packs/day: 0.25  . Smokeless tobacco: Never Used  Substance Use Topics  . Alcohol use: No  . Drug  use: No    Home Medications Prior to Admission medications   Medication Sig Start Date End Date Taking? Authorizing Provider  acetaminophen (TYLENOL) 500 MG tablet Take 1,500 mg by mouth as needed for pain (up to 4,000 mg).    [provider]  albuterol (PROAIR HFA) 108 (90 Base) MCG/ACT inhaler Inhale 2 puffs into the lungs every 6 (six) hours as needed for wheezing or shortness of breath. 09/28/18   Chesley Mires, MD  clonazePAM (KLONOPIN) 0.5 MG tablet Take 1 tablet (0.5 mg total) by mouth daily. 12/17/14   Tanna Furry, MD  clonazePAM (KLONOPIN) 1 MG tablet Take 1 tablet (1 mg total) by mouth 2 (two) times daily. Patient taking differently: Take 1 mg by mouth at bedtime.  12/17/14   Tanna Furry, MD  donepezil (ARICEPT) 5 MG tablet TAKE 1 TABLET BY MOUTH AT BEDTIME 12/30/18   Kathrynn Ducking, MD  traZODone (DESYREL) 100 MG tablet Take 100 mg by mouth at  bedtime.     [provider]  venlafaxine XR (EFFEXOR-XR) 75 MG 24 hr capsule Take by mouth 3 (three) times daily.     [provider]    Allergies    Dilantin [phenytoin], Lactose intolerance (gi), Shellfish allergy, and Sulfa antibiotics  Review of Systems   Review of Systems  All other systems reviewed and are negative.   Physical Exam Updated Vital Signs BP (!) 144/65 (BP Location: Left Arm)   Pulse 63   Temp 98.6 F (37 C) (Oral)   Resp 18   SpO2 94%   Physical Exam Vitals and nursing note reviewed.  Constitutional:      General: She is not in acute distress.    Appearance: Normal appearance. She is well-developed. She is not toxic-appearing.  HENT:     Head: Normocephalic and atraumatic.  Eyes:     General: Lids are normal.     Conjunctiva/sclera: Conjunctivae normal.     Pupils: Pupils are equal, round, and reactive to light.  Neck:     Thyroid: No thyroid mass.     Trachea: No tracheal deviation.  Cardiovascular:     Rate and Rhythm: Normal rate and regular rhythm.     Heart sounds: Normal heart sounds. No murmur. No gallop.   Pulmonary:     Effort: Pulmonary effort is normal. No respiratory distress.     Breath sounds: Normal breath sounds. No stridor. No decreased breath sounds, wheezing, rhonchi or rales.  Abdominal:     General: Bowel sounds are normal. There is no distension.     Palpations: Abdomen is soft.     Tenderness: There is no abdominal tenderness. There is no rebound.  Musculoskeletal:        General: No tenderness. Normal range of motion.     Cervical back: Normal range of motion and neck supple.  Skin:    General: Skin is warm and dry.     Findings: No abrasion or rash.  Neurological:     General: No focal deficit present.     Mental Status: She is alert and oriented to person, place, and time. Mental status is at baseline.     GCS: GCS eye subscore is 4. GCS verbal subscore is 5. GCS motor subscore is 6.     Cranial  Nerves: No cranial nerve deficit.     Sensory: No sensory deficit.  Psychiatric:        Attention and Perception: Attention normal.  Mood and Affect: Affect is blunt.        Speech: Speech normal.        Behavior: Behavior normal.        Thought Content: Thought content does not include suicidal ideation. Thought content does not include suicidal plan.     ED Results / Procedures / Treatments   Labs (all labs ordered are listed, but only abnormal results are displayed) Labs Reviewed  RESPIRATORY PANEL BY RT PCR (FLU A&B, COVID)  ETHANOL  RAPID URINE DRUG SCREEN, HOSP PERFORMED  CBC WITH DIFFERENTIAL/PLATELET  COMPREHENSIVE METABOLIC PANEL    EKG None  Radiology No results found.  Procedures Procedures (including critical care time)  Medications Ordered in ED Medications  acetaminophen (TYLENOL) tablet 650 mg (has no administration in time range)  ondansetron (ZOFRAN) tablet 4 mg (has no administration in time range)  albuterol (VENTOLIN HFA) 108 (90 Base) MCG/ACT inhaler 2 puff (has no administration in time range)  acetaminophen (TYLENOL) tablet 500 mg (has no administration in time range)    ED Course  I have reviewed the triage vital signs and the nursing notes.  Pertinent labs & imaging results that were available during my care of the patient were reviewed by me and considered in my medical decision making (see chart for details).    MDM Rules/Calculators/A&P                      We will check medical clearance labs.  TTS consult placed Final Clinical Impression(s) / ED Diagnoses Final diagnoses:  None    Rx / DC Orders ED Discharge Orders    None       Lacretia Leigh, MD 03/21/20 1127

## 2020-03-21 NOTE — ED Triage Notes (Signed)
Pt brought in for medical clearance and psych evaluation by her daughter. Pt denies SI or HI at this time. Sent by psychiatrist

## 2020-03-21 NOTE — BH Assessment (Addendum)
Tele Assessment Note   Patient Name: [name] MRN: [id number] Referring Physician: Lacretia Leigh, MD Location of Patient: [facility]  Location of Provider: [place] [facility]  [name] is an 79 y.o. 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  Patient's daughter reported patient discontinued medications several weeks ago for no apparent reason.  Patient is being followed by [name], PA of [place] for medication management.  Patient's daughter has expressed concern that her mother is regressing and experiencing worsening depression, psychosis and recent onset of delusional and dissociative symptomology.  Since discontinuing medications, patient's daughter has noticed worsening depressive symptoms of patient isolating and not wanting to shower and she is quite fixated on a delusion of a man named Percey who plans to "come and take me away."   She has also noticed what appears to be alter personalities, as evidenced by various voices, to include the voice of a child most recently.  Per daughter, patient was diagnosed with Dissociative Identity Disorder, with alters integrated 25 years ago with no episodes since.  Patient lives with daughter and has assistance from daughter with ADLs.    Upon assessment, patient states she is here because her daughter and psychologist wanted her to be seen.  Patient confirms psychiatric history provided by her daughter.  She admits to discontinuing medications, stating they just "didn't do much for me."  She denies SI and HI, stating "oh I'm perfectly fine, I feel just fine."  She denies SI, HI and AVH initially.  She then perseverates on delusional content of a man named Percey whom she believes she was married to for a week many years ago.  She states Percey called her recently to  inform her that they are indeed still married, as neither filed for divorce.  She then shares that she is "confused, I hear him having conversations all day and he's not with me."  She also states he calls her and just called her 15 minutes ago to let her know he is on his way to [employer].  She is not open to discussion of going back on prescribed medications until "you all talk to [facility], he needs to be a part of this."  She is hoping Percey will show up and "prove them all wrong."    Patient is calm, cooperative and behaviorally appropriate.  Patient is appropriately dressed.  Speech is soft.  Eye contact is fair.  Patient's mood is mildly anxious and somewhat guarded.   Affect is congruent with mood.  Thought process is circumstantial with delusional content.  Patient endorses auditory hallucinations that are not command type.  Judgement and insight are impaired.    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 Abnormality of gait 05/27/2013  . Cancer ([place])   . COPD (chronic obstructive pulmonary disease) ([place])   . Depression   . Diplopia    Monocular, OS  . Epilepsy ([place])   . Hearing deficit   . High cholesterol   . Lung cancer (  [place])   . Memory deficit 05/27/2013  . Multiple sclerosis ([place])   . Murmur   . Scoliosis   . Syncope   . Urine incontinence     Past Surgical History:  Procedure Laterality Date  . ABDOMINAL HYSTERECTOMY  2007  . APPENDECTOMY  1952  . CESAREAN SECTION  1980  . CHOLECYSTECTOMY  2006  . LUNG BIOPSY  2012  . OVARIAN CYST REMOVAL  2008  . Skin biopsy Left 12/08/2018   Superior Medial Anterior Ankle - Basal Cell Carcinoma, nodular pattern, ulcerated    Family History:  Family History  Problem Relation Age of Onset  . Stroke Mother   .  Heart disease Mother   . Breast cancer Mother   . Pancreatitis Mother   . Alcohol abuse Mother   . Arthritis Mother   . Cancer Mother   . Hyperlipidemia Mother   . Mental illness Mother   . Leukemia Father   . Heart disease Father   . Alcohol abuse Father   . Arthritis Father   . Cancer Father   . Alcohol abuse Sister   . Arthritis Sister   . COPD Sister   . Depression Sister   . Alcohol abuse Brother   . Diabetes Brother   . Kidney disease Brother   . Heart disease Maternal Uncle   . Diverticulitis Paternal Aunt   . Heart disease Brother   . Heart attack Brother   . Hypertension Brother   . Mental illness Brother   . Heart attack Brother   . Heart disease Brother   . Hypertension Brother   . Alcohol abuse Daughter   . Cancer Daughter   . COPD Daughter   . Drug abuse Daughter     Social History:  reports that she has been smoking. She has been smoking about 0.25 packs per day. She has never used smokeless tobacco. She reports that she does not drink alcohol or use drugs.  Additional Social History:  Alcohol / Drug Use Pain Medications: See MAR Prescriptions: See MAR Over the Counter: See MAR History of alcohol / drug use?: No history of alcohol / drug abuse Longest period of sobriety (when/how long): N/A  CIWA: CIWA-Ar BP: (!) 144/65 Pulse Rate: 63 COWS:    Allergies:  Allergies  Allergen Reactions  . Dilantin [Phenytoin] Hives  . Lactose Intolerance (Gi) Diarrhea  . Shellfish Allergy Other (See Comments)    Childhood allergy--unknown reaction  . Sulfa Antibiotics Hives    Home Medications: (Not in a hospital admission)   OB/GYN Status:  No LMP recorded. Patient has had a hysterectomy.  General Assessment Data Location of Assessment: [facility] TTS Assessment: In system Is this a Tele or Face-to-Face Assessment?: Tele Assessment Is this an Initial Assessment or a Re-assessment for this encounter?: Initial Assessment Patient Accompanied by:: (Daughter,  HCPOA) Language Other than English: No Living Arrangements: Other (Comment)(Private home) What gender do you identify as?: Female Marital status: Divorced [place] name: Unknown Pregnancy Status: No Living Arrangements: Other relatives(Lives with daughter an son-in-law) Can pt return to current living arrangement?: Yes(Daughter concerned she may not be able to care for pt.) Admission Status: Other (Comment)(HCPOA present) Is patient capable of signing voluntary admission?: No Referral Source: Psychiatrist Insurance type: [facility]     Crisis Care Plan Living Arrangements: Other relatives(Lives with daughter an son-in-law) Legal Guardian: Other relative(Daughter, Darly-HCPOA) Name of Psychiatrist: [name], [place] Name of Therapist: None  Education Status Is patient currently in school?: No  Is the patient employed, unemployed or receiving disability?: (retired)  Risk to self with the past 6 months Suicidal Ideation: No Has patient been a risk to self within the past 6 months prior to admission? : Yes(elevated risk, has made suicidal statements) Suicidal Intent: No Has patient had any suicidal intent within the past 6 months prior to admission? : No Is patient at risk for suicide?: Yes(low risk) Suicidal Plan?: No Has patient had any suicidal plan within the past 6 months prior to admission? : No Access to Means: No What has been your use of drugs/alcohol within the last 12 months?: No history Previous Attempts/Gestures: Yes How many times?: 1 Other Self Harm Risks: None Triggers for Past Attempts: (family conflict) Intentional Self Injurious Behavior: None Family Suicide History: No Recent stressful life event(s): Other (Comment)(feeling isolated, alone, bored) Persecutory voices/beliefs?: Yes Depression: Yes Depression Symptoms: Isolating, Feeling worthless/self pity Substance abuse history and/or treatment for substance abuse?: No Suicide prevention information given  to non-admitted patients: Not applicable  Risk to Others within the past 6 months Homicidal Ideation: No Does patient have any lifetime risk of violence toward others beyond the six months prior to admission? : No Thoughts of Harm to Others: No Current Homicidal Intent: No Current Homicidal Plan: No Access to Homicidal Means: No Identified Victim: N/A History of harm to others?: No Assessment of Violence: None Noted Violent Behavior Description: N/A Does patient have access to weapons?: No Criminal Charges Pending?: No Does patient have a court date: No Is patient on probation?: No  Psychosis Hallucinations: Auditory(Hears "Percey talking to me" often) Delusions: Grandiose, Unspecified  Mental Status Report Appearance/Hygiene: Unremarkable Eye Contact: Fair Motor Activity: Unremarkable Speech: Soft Level of Consciousness: Alert Mood: Preoccupied, Depressed Affect: Depressed, Blunted Anxiety Level: Minimal Thought Processes: Irrelevant, Circumstantial Judgement: Impaired Orientation: Person, Place, Time Obsessive Compulsive Thoughts/Behaviors: Minimal  Cognitive Functioning Concentration: Decreased Memory: Recent Impaired, Remote Impaired Is patient IDD: No Insight: Poor Impulse Control: Fair Appetite: Fair Have you had any weight changes? : No Change Sleep: No Change Total Hours of Sleep: 7 Vegetative Symptoms: Staying in bed  ADLScreening [facility] Assessment Services) Patient's cognitive ability adequate to safely complete daily activities?: No Patient able to express need for assistance with ADLs?: No Independently performs ADLs?: No  Prior Inpatient Therapy Prior Inpatient Therapy: Yes Prior Therapy Dates: 1993 Prior Therapy Facilty/Provider(s[facility]- 1 month admission Reason for Treatment: SI , overdose attempt  Prior Outpatient Therapy Prior Outpatient Therapy: Yes Prior Therapy Dates: ongoing Prior Therapy Facilty/Provider(s): [place] Reason for Treatment: Depression, Dissociative D/O, Dimentia Does patient have an ACCT team?: No Does patient have Intensive In-House Services?  : No Does patient have [facility] services? : No Does patient have P4CC services?: No  ADL Screening (condition at time of admission) Patient's cognitive ability adequate to safely complete daily activities?: No Is the patient deaf or have difficulty hearing?: Yes(some difficulty hearing) Does the patient have difficulty seeing, even when wearing glasses/contacts?: No Does the patient have difficulty concentrating, remembering, or making decisions?: Yes Patient able to express need for assistance with ADLs?: No Does the patient have difficulty dressing or bathing?: Yes Independently performs ADLs?: No Communication: Independent Dressing (OT): Needs assistance Grooming: Needs assistance Feeding: Independent Bathing: Needs assistance Toileting: Independent In/Out Bed: Independent Walks in Home: Independent Does the patient have difficulty walking or climbing stairs?: Yes(walks in home, sometimes holds railings) Weakness of Legs: None Weakness of Arms/Hands: None  Home Assistive Devices/Equipment Home Assistive Devices/Equipment:  Wheelchair  Therapy Consults (therapy consults require a physician order) PT Evaluation Needed: No OT Evalulation Needed: No SLP Evaluation Needed: No Abuse/Neglect Assessment (Assessment to be complete while patient is alone) Abuse/Neglect Assessment Can Be Completed: Yes Physical Abuse: Denies Verbal Abuse: Denies Sexual Abuse: Denies Exploitation of patient/patient's resources: Denies Self-Neglect: Denies Values / Beliefs Cultural Requests During Hospitalization: None Spiritual Requests During Hospitalization: None Consults Spiritual Care Consult Needed: No Transition of Care Team Consult Needed: No Advance Directives (For Healthcare) Does Patient Have a Medical Advance Directive?:  No Would patient like information on creating a medical advance directive?: No - Patient declined, No - Guardian declined      Disposition: Per Earleen Newport, NP inpatient geriatric psychiatry admission is recommended.  TTS will pursue inpatient treatment for patient.   Disposition Initial Assessment Completed for this Encounter: Yes  This service was provided via telemedicine using a 2-way, interactive audio and video technology.  Names of all persons participating in this telemedicine service and their role in this encounter. Name: Laurell Roof, [facility] Role: TTS Therapist  Name: Earleen Newport, NP Role: TTS Provider  Name: Waldemar Dickens Role: pt  Name: Waldemar Dickens Role: daughter    Fransico Meadow 03/21/2020 2:27 PM

## 2020-03-22 ENCOUNTER — Encounter (HOSPITAL_COMMUNITY): Payer: Self-pay | Admitting: Registered Nurse

## 2020-03-22 DIAGNOSIS — F419 Anxiety disorder, unspecified: Secondary | ICD-10-CM

## 2020-03-22 DIAGNOSIS — F4321 Adjustment disorder with depressed mood: Secondary | ICD-10-CM

## 2020-03-22 MED ORDER — VENLAFAXINE HCL ER 75 MG PO CP24: 75.0000 mg | ORAL_CAPSULE | Freq: Every day | ORAL | 0 refills | Status: AC

## 2020-03-22 NOTE — BH Assessment (Addendum)
[place] Assessment Progress Note  Per Shuvon Rankin, FNP, this pt does not require psychiatric hospitalization at this time.  Pt is to be discharged from [facility] with recommendation to continue treatment with her outpatient psychiatrist, Leanord Hawking, MD.  This has been included in pt's discharge instructions.  Pt's nurse, Tilda Franco, has been notified.  Jalene Mullet, MA Triage Specialist [phone]   Addendum:  At 14:05 this writer called pt's daughter, also named Aleli [phone]), to discuss disposition with her.  She agrees to presents at [facility] in about 90 minutes to pick her up.  She will appreciate any resources that can be offered to help manage pt in the community.  A social work consult has been ordered for pt, and this Probation officer has informed both Leeroy Cha, CSW and [place].  Jalene Mullet, [place] Coordinator [phone]

## 2020-03-22 NOTE — Consult Note (Signed)
[facility] Psych ED Discharge  03/22/2020 12:31 PM [name]  MRN:  [id number] Principal Problem: <principal problem not specified> Discharge Diagnoses: Active Problems:   Anxiety   Subjective: ""I was told I had to stay here and couldn't leave"  Assessment:  [name], 79 y.o., female patient seen via tele psych by this provider, Dr. Dwyane Dee; and chart reviewed on 03/22/20.  On evaluation [name] reports she was brought to the hospital by her daughter.  Patient states that she has no problems but the problem is her daughter.  States that she lives with her daughter and son in [place].  "I told my daughter about 3 days ago that a man I was married to [name] came to see me and told me we was still married.  I thought we was divorced but he thought I filed and I thought he filed and he came to tell me we was still married."  Patient states she hasn't seen or talk to since "but my daughter think I'm crazy.  She gets upset when I don't do what she wants when she wants.  I don't bathe everyday cause I don't go now where and I don't do nothing that I have to take a bath and change clothes everyday.  I don't do nothing but sit around the house they won't let me go anywhere.  [place] I don't drive any more; with good reason, but I'm just there."  Patient feel that she isn't allowed to go anywhere related her daughter not letting her; wanting to be around people her age to socialize but states daughter won't let her.  Patient denies suicidal/self-harm/homicidal ideation, psychosis, and paranoia.  Patient states that she did stop taking her psychotropic medications.  "[name] had me taking something for sleep, something for anxiety, and something to help my thinking.  They won't helping nothing and I didn't feel like I needed them so I stopped.  3 weeks ago.  No nothing has changed; I don't feel no better or no worse; about the same.  I still don't think I need them."  Patient  also states that the medications cost money which she can't afford. During evaluation [name] is alert/oriented x 4; Patient was able to give correct information for her DOB, AGE, [place], [place], Last 3 presidents, Current place, and current month.  She was stated that today's date was 22nd or 23rd of April.  Patient was calm/cooperative throughout assessment; and her mood is congruent with affect.  He/She does not appear to be responding to internal/external stimuli or delusional thoughts.  Patient denies suicidal/self-harm/homicidal ideation, psychosis, and paranoia.  Patient answered question appropriately.     Total Time spent with patient: 30 minutes  Past Psychiatric History: Anxiety  Past Medical History:  Past Medical History:  Diagnosis Date  . Abnormality of gait 05/27/2013  . Cancer ([place])   . COPD (chronic obstructive pulmonary disease) ([place])   . Depression   . Diplopia    Monocular, OS  . Epilepsy ([place])   . Hearing deficit   . High cholesterol   . Lung cancer ([place])   . Memory deficit 05/27/2013  . Multiple sclerosis ([place])   . Murmur   . Scoliosis   . Syncope   . Urine incontinence     Past Surgical History:  Procedure Laterality Date  . ABDOMINAL HYSTERECTOMY  2007  . APPENDECTOMY  1952  . CESAREAN SECTION  1980  .  CHOLECYSTECTOMY  2006  . LUNG BIOPSY  2012  . OVARIAN CYST REMOVAL  2008  . Skin biopsy Left 12/08/2018   Superior Medial Anterior Ankle - Basal Cell Carcinoma, nodular pattern, ulcerated   Family History:  Family History  Problem Relation Age of Onset  . Stroke Mother   . Heart disease Mother   . Breast cancer Mother   . Pancreatitis Mother   . Alcohol abuse Mother   . Arthritis Mother   . Cancer Mother   . Hyperlipidemia Mother   . Mental illness Mother   . Leukemia Father   . Heart disease Father   . Alcohol abuse Father   . Arthritis Father   . Cancer Father   . Alcohol abuse Sister   . Arthritis Sister   . COPD Sister   .  Depression Sister   . Alcohol abuse Brother   . Diabetes Brother   . Kidney disease Brother   . Heart disease Maternal Uncle   . Diverticulitis Paternal Aunt   . Heart disease Brother   . Heart attack Brother   . Hypertension Brother   . Mental illness Brother   . Heart attack Brother   . Heart disease Brother   . Hypertension Brother   . Alcohol abuse Daughter   . Cancer Daughter   . COPD Daughter   . Drug abuse Daughter    Family Psychiatric  History: See above Social History:  Social History   Substance and Sexual Activity  Alcohol Use No     Social History   Substance and Sexual Activity  Drug Use No    Social History   Socioeconomic History  . Marital status: Divorced    Spouse name: Not on file  . Number of children: 1  . Years of education: 58  . Highest education level: Master's degree (e.g., MA, MS, MEng, MEd, MSW, MBA)  Occupational History    Employer: RETIRED  Tobacco Use  . Smoking status: Current Every Day Smoker    Packs/day: 0.25  . Smokeless tobacco: Never Used  Substance and Sexual Activity  . Alcohol use: No  . Drug use: No  . Sexual activity: Not on file  Other Topics Concern  . Not on file  Social History Narrative  . Not on file   Social Determinants of Health   Financial Resource Strain:   . Difficulty of Paying Living Expenses:   Food Insecurity:   . Worried About Charity fundraiser in the Last Year:   . Arboriculturist in the Last Year:   Transportation Needs:   . Film/video editor (Medical):   Marland Kitchen Lack of Transportation (Non-Medical):   Physical Activity:   . Days of Exercise per Week:   . Minutes of Exercise per Session:   Stress:   . Feeling of Stress :   Social Connections:   . Frequency of Communication with Friends and Family:   . Frequency of Social Gatherings with Friends and Family:   . Attends Religious Services:   . Active Member of Clubs or Organizations:   . Attends Archivist Meetings:   Marland Kitchen  Marital Status:     Has this patient used any form of tobacco in the last 30 days? (Cigarettes, Smokeless Tobacco, Cigars, and/or Pipes) A prescription for an FDA-approved tobacco cessation medication was offered at discharge and the patient refused  Current Medications: Current Facility-Administered Medications  Medication Dose Route Frequency Provider Last Rate Last Admin  .  acetaminophen (TYLENOL) tablet 500 mg  500 mg Oral Q6H PRN Lacretia Leigh, MD      . acetaminophen (TYLENOL) tablet 650 mg  650 mg Oral Q4H PRN Lacretia Leigh, MD      . albuterol (VENTOLIN HFA) 108 (90 Base) MCG/ACT inhaler 2 puff  2 puff Inhalation Q6H PRN Lacretia Leigh, MD      . ondansetron [facility]) tablet 4 mg  4 mg Oral Q8H PRN Lacretia Leigh, MD       Current Outpatient Medications  Medication Sig Dispense Refill  . albuterol (PROAIR HFA) 108 (90 Base) MCG/ACT inhaler Inhale 2 puffs into the lungs every 6 (six) hours as needed for wheezing or shortness of breath. 1 Inhaler 5  . clonazePAM (KLONOPIN) 0.5 MG tablet Take 1 tablet (0.5 mg total) by mouth daily. (Patient taking differently: Take 0.5 mg by mouth 2 (two) times daily as needed for anxiety. ) 14 tablet 0  . donepezil (ARICEPT) 5 MG tablet TAKE 1 TABLET BY MOUTH AT BEDTIME (Patient taking differently: Take 5 mg by mouth at bedtime. ) 90 tablet 0  . traZODone (DESYREL) 50 MG tablet Take 50 mg by mouth at bedtime.    Marland Kitchen venlafaxine XR (EFFEXOR-XR) 75 MG 24 hr capsule Take 1 capsule (75 mg total) by mouth daily. 30 capsule 0   PTA Medications: (Not in a hospital admission)   Musculoskeletal: Strength & Muscle Tone: within normal limits Gait & Station: normal Patient leans: N/A  Psychiatric Specialty Exam: Physical Exam Vitals and nursing note reviewed. Exam conducted with a chaperone present.  Constitutional:      Appearance: Normal appearance.  Pulmonary:     Effort: Pulmonary effort is normal.  Neurological:     Mental Status: She is alert.   Psychiatric:        Attention and Perception: Attention and perception normal. She does not perceive auditory or visual hallucinations.        Mood and Affect: Mood normal.        Speech: Speech normal.        Behavior: Behavior normal. Behavior is cooperative.        Thought Content: Thought content normal. Thought content is not paranoid. Delusional: Patient did make one comment about [name]. Thought content does not include homicidal or suicidal ideation.        Cognition and Memory: Cognition and memory normal.        Judgment: Judgment normal.     Review of Systems  All other systems reviewed and are negative.   Blood pressure (!) 149/81, pulse 78, temperature 98.8 F (37.1 C), temperature source Oral, resp. rate 17, SpO2 94 %.There is no height or weight on file to calculate BMI.  General Appearance: Casual  Eye Contact:  Good  Speech:  Clear and Coherent and Normal Rate  Volume:  Normal  Mood:  "Fine"  Affect:  Appropriate and Congruent  Thought Process:  Coherent, Goal Directed and Descriptions of Associations: Intact  Orientation:  Full (Time, Place, and Person)  Thought Content:  WDL  Suicidal Thoughts:  No  Homicidal Thoughts:  No  Memory:  Immediate;   Good Recent;   Good  Judgement:  Intact  Insight:  Present  Psychomotor Activity:  Normal  Concentration:  Concentration: Good and Attention Span: Good  Recall:  Good  Fund of Knowledge:  Good  Language:  Good  Akathisia:  No  Handed:  Right  AIMS (if indicated):     Assets:  Communication  Skills Desire for Improvement Housing Social Support  ADL's:  Intact  Cognition:  WNL  Sleep:        Demographic Factors:  Age 60 or older and Caucasian  Loss Factors: NA  Historical Factors: NA  Risk Reduction Factors:   Sense of responsibility to family, Religious beliefs about death, Living with another person, especially a relative and Positive social support  Continued Clinical Symptoms:   Dementia  Cognitive Features That Contribute To Risk:  None    Suicide Risk:  Minimal: No identifiable suicidal ideation.  Patients presenting with no risk factors but with morbid ruminations; may be classified as minimal risk based on the severity of the depressive symptoms   Plan Of Care/Follow-up recommendations:  Activity:  As tolerated Diet:  Heart healthy Resource and referral to outpatient psychiatric services    Discharge Instructions     For your behavioral health needs, you are advised to continue treatment with Leanord Hawking, MD:       Leanord Hawking, MD      [facility]      [address] [place], [place] [address]      [phone]     Disposition:  Psychiatrically cleared No evidence of imminent risk to self or others at present.   Patient does not meet criteria for psychiatric inpatient admission. Supportive therapy provided about ongoing stressors. Discussed crisis plan, support from social network, calling 911, coming to the Emergency Department, and calling Suicide Hotline.   Erilyn Pearman, NP 03/22/2020, 12:31 PM

## 2020-03-22 NOTE — Progress Notes (Signed)
03/22/2020  1600  Patient's daughter is requesting to get all medical records from patients stay. Patient's daughter signed a medical records release form and a copy of the HCPOA was copied and sent to medical records with the signed release form. Advised daughter to call tomorrow and ask for medical records to find out when she can pick up records.

## 2020-03-22 NOTE — Discharge Instructions (Signed)
For your behavioral health needs, you are advised to continue treatment with Leanord Hawking, MD:       Leanord Hawking, MD      [facility]      [address] [place], [place] [address]      [phone]

## 2020-03-22 NOTE — Progress Notes (Signed)
TOC consult received regarding patient needing placement. Patient was evaluated by psych and was psych cleared. CSW spoke with patient's daughter regarding resources for ALF/memory care placement. CSW emailed patient's daughter list of ALFs in the area and walked her through the process of getting admitted through her PCP office as well as financial needs. CSW also recommended respite care if needed.   Patient's daughter reports she is waiting for her husband to get home to come pick patient up. She reports this should be within the next hour. Patient's daughter was very thankful for the resources and assistance from [place].  [place] Circle, LCSW Transitions of Care Department [facility] ED [phone]

## 2020-03-22 NOTE — ED Provider Notes (Signed)
Emergency Medicine Observation Re-evaluation Note  [name] is a 79 y.o. female, seen on rounds today.  Pt initially presented to the ED for complaints of Medical Clearance Currently, the patient is resting comfortably.  Physical Exam  BP (!) 149/81 (BP Location: Left Arm)   Pulse 78   Temp 98.8 F (37.1 C) (Oral)   Resp 17   SpO2 94%  Physical Exam Vitals and nursing note reviewed.  Constitutional:      Appearance: She is well-developed. She is not toxic-appearing.  HENT:     Head: Atraumatic.  Cardiovascular:     Rate and Rhythm: Normal rate.  Pulmonary:     Effort: Pulmonary effort is normal.  Musculoskeletal:     Cervical back: Normal range of motion and neck supple.  Neurological:     Mental Status: Mental status is at baseline.     ED Course / MDM  EKG:    I have reviewed the labs performed to date as well as medications administered while in observation.  Recent changes in the last 24 hours include none from medical perspective. Plan  Current plan is for patient to be assessed by psych.  Patient likely will need placement. Patient is not under full IVC at this time.   Varney Biles, MD 03/22/20 [phone]

## 2020-03-22 NOTE — Progress Notes (Signed)
03/22/2020  1302  Called patient's daughter Nasreen [phone]. Left message to return call and time she would be able to pick up her mom.

## 2020-03-27 ENCOUNTER — Other Ambulatory Visit: Payer: Self-pay

## 2020-03-28 ENCOUNTER — Ambulatory Visit (INDEPENDENT_AMBULATORY_CARE_PROVIDER_SITE_OTHER): Payer: Medicare PPO | Admitting: Adult Health

## 2020-03-28 ENCOUNTER — Encounter: Payer: Self-pay | Admitting: Adult Health

## 2020-04-12 ENCOUNTER — Encounter: Payer: Self-pay | Admitting: Neurology

## 2020-04-12 ENCOUNTER — Other Ambulatory Visit: Payer: Self-pay

## 2020-04-12 ENCOUNTER — Ambulatory Visit: Payer: Medicare PPO | Admitting: Neurology

## 2020-04-12 VITALS — BP 125/69 | HR 99 | Ht 63.0 in | Wt 126.0 lb

## 2020-04-12 DIAGNOSIS — R413 Other amnesia: Secondary | ICD-10-CM

## 2020-04-12 DIAGNOSIS — R269 Unspecified abnormalities of gait and mobility: Secondary | ICD-10-CM | POA: Diagnosis not present

## 2020-04-12 DIAGNOSIS — G35 Multiple sclerosis: Secondary | ICD-10-CM

## 2020-04-12 MED ORDER — DONEPEZIL HCL 5 MG PO TABS: 5.0000 mg | ORAL_TABLET | Freq: Every day | ORAL | 3 refills | Status: DC

## 2020-04-12 NOTE — Progress Notes (Signed)
PATIENT: [name] DOB: Nov 18, 1941  REASON FOR VISIT: follow up HISTORY FROM: patient  HISTORY OF PRESENT ILLNESS: Today 04/12/20  [name] is a 79 year old female with history of multiple sclerosis with a progressive form.  She was previously on Betaseron, but has not been on any disease modifying agents in quite a while. She has a gait disorder, history of lung cancer.  She has trouble with memory.  When last seen, she was sent for physical therapy for gait training, nerve conduction study to check for peripheral neuropathy contributing to tingling in legs worsening balance, did not do therapy due to concern for being exposed to Covid, no longer complains of the tingling, felt this was related to a basal cell she had removed to her left ankle.  No recent falls, is careful with balance.  She was recently in the ER for medical clearance, due to a psychiatric issue, she has dissociative disorder, for about 7 weeks she stopped all of her medications, was having hallucinations.  She got back on her medications, is doing much better.  She lives with her daughter, her daughter now manages her medications.  Following the psychiatric crisis, they were considering having her placed in a memory unit, but she improved, have now decided to keep her in the home.  Her daughter is able to work from home, they will have an aide 1 day a week.  She does all her own ADLs, but needs reminding about bathing.  She is not very active, mostly watches TV, and naps.  Her daughter has noticed that she talks to herself, she says this is because she does not have anyone else to talk to you.  She presents today for follow-up accompanied by her daughter.  HISTORY 06/23/2019 Dr. Jannifer Franklin: [name] is a 35 year old right-handed white female with a history of multiple sclerosis with a progressive form.  The patient had been on Betaseron in the past but she has not been on any disease modifying agents in quite some time.   The patient has developed a gait disorder, over the last 4 months she believes that this has worsened and has been associated with some tingling sensations in the feet at this point.  The patient has a history of lung cancer, she continues to abuse tobacco unfortunately.  The patient indicates that this issue has been stable.  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  She reports that she has not had any recent falls but her overall equilibrium has worsened.  She denies any burning sensations in the feet in the evening, but again has tingling in the feet and legs that is new for her.  She continues to have some troubles with memory, she is on low-dose Aricept at 5 mg daily, she was not able to tolerate higher doses.  She denies any problems tolerating the Aricept, she is not having diarrhea at this point.  REVIEW OF SYSTEMS: Out of a complete 14 system review of symptoms, the patient complains only of the following symptoms, and all other reviewed systems are negative.  Memory loss  ALLERGIES: Allergies  Allergen Reactions  . Dilantin [Phenytoin] Hives  . Lactose Intolerance (Gi) Diarrhea  . Shellfish Allergy Other (See Comments)    Childhood allergy--unknown reaction  . Sulfa Antibiotics Hives    HOME MEDICATIONS: Outpatient Medications Prior to Visit  Medication Sig Dispense Refill  . aspirin EC 81 MG tablet Take 81 mg by mouth daily.    Marland Kitchen  clonazePAM (KLONOPIN) 0.5 MG tablet Take 1 tablet (0.5 mg total) by mouth daily. (Patient taking differently: Take 0.5 mg by mouth 2 (two) times daily as needed for anxiety. ) 14 tablet 0  . traZODone (DESYREL) 50 MG tablet Take 50 mg by mouth at bedtime.    Marland Kitchen venlafaxine XR (EFFEXOR-XR) 75 MG 24 hr capsule Take 1 capsule (75 mg total) by mouth daily. (Patient taking differently: daily. 3 capsules daily.) 30 capsule 0  . donepezil (ARICEPT) 5 MG tablet TAKE 1 TABLET BY MOUTH AT BEDTIME (Patient taking differently: Take 5 mg  by mouth at bedtime. ) 90 tablet 0   No facility-administered medications prior to visit.    PAST MEDICAL HISTORY: Past Medical History:  Diagnosis Date  . Abnormality of gait 05/27/2013  . Cancer ([place])   . COPD (chronic obstructive pulmonary disease) ([place])   . Depression   . Diplopia    Monocular, OS  . Epilepsy ([place])   . Hearing deficit   . High cholesterol   . Lung cancer ([place])   . Memory deficit 05/27/2013  . Multiple sclerosis ([place])   . Murmur   . Scoliosis   . Syncope   . Urine incontinence     PAST SURGICAL HISTORY: Past Surgical History:  Procedure Laterality Date  . ABDOMINAL HYSTERECTOMY  2007  . APPENDECTOMY  1952  . CESAREAN SECTION  1980  . CHOLECYSTECTOMY  2006  . LUNG BIOPSY  2012  . OVARIAN CYST REMOVAL  2008  . Skin biopsy Left 12/08/2018   Superior Medial Anterior Ankle - Basal Cell Carcinoma, nodular pattern, ulcerated    FAMILY HISTORY: Family History  Problem Relation Age of Onset  . Stroke Mother   . Heart disease Mother   . Breast cancer Mother   . Pancreatitis Mother   . Alcohol abuse Mother   . Arthritis Mother   . Cancer Mother   . Hyperlipidemia Mother   . Mental illness Mother   . Leukemia Father   . Heart disease Father   . Alcohol abuse Father   . Arthritis Father   . Cancer Father   . Alcohol abuse Sister   . Arthritis Sister   . COPD Sister   . Depression Sister   . Alcohol abuse Brother   . Diabetes Brother   . Kidney disease Brother   . Heart disease Maternal Uncle   . Diverticulitis Paternal Aunt   . Heart disease Brother   . Heart attack Brother   . Hypertension Brother   . Mental illness Brother   . Heart attack Brother   . Heart disease Brother   . Hypertension Brother   . Alcohol abuse Daughter   . Cancer Daughter   . COPD Daughter   . Drug abuse Daughter     SOCIAL HISTORY: Social History   Socioeconomic History  . Marital status: Divorced    Spouse name: Not on file  . Number of children: 1  .  Years of education: 21  . Highest education level: Master's degree (e.g., MA, MS, MEng, MEd, MSW, MBA)  Occupational History    Employer: RETIRED  Tobacco Use  . Smoking status: Current Every Day Smoker    Packs/day: 0.25  . Smokeless tobacco: Never Used  Substance and Sexual Activity  . Alcohol use: No  . Drug use: No  . Sexual activity: Not on file  Other Topics Concern  . Not on file  Social History Narrative  . Not on file  Social Determinants of Health   Financial Resource Strain:   . Difficulty of Paying Living Expenses:   Food Insecurity:   . Worried About Charity fundraiser in the Last Year:   . Arboriculturist in the Last Year:   Transportation Needs:   . Film/video editor (Medical):   Marland Kitchen Lack of Transportation (Non-Medical):   Physical Activity:   . Days of Exercise per Week:   . Minutes of Exercise per Session:   Stress:   . Feeling of Stress :   Social Connections:   . Frequency of Communication with Friends and Family:   . Frequency of Social Gatherings with Friends and Family:   . Attends Religious Services:   . Active Member of Clubs or Organizations:   . Attends Archivist Meetings:   Marland Kitchen Marital Status:   Intimate Partner Violence:   . Fear of Current or Ex-Partner:   . Emotionally Abused:   Marland Kitchen Physically Abused:   . Sexually Abused:       PHYSICAL EXAM  Vitals:   04/12/20 0833  BP: 125/69  Pulse: 99  Weight: 126 lb (57.2 kg)  Height: 5\' 3"  (1.6 m)   Body mass index is 22.32 kg/m.  Generalized: Well developed, in no acute distress  MMSE - Mini Mental State Exam 04/12/2020 07/07/2018  Orientation to time 4 5  Orientation to Place 5 5  Registration 3 3  Attention/ Calculation 2 3  Recall 3 3  Language- name 2 objects 2 2  Language- repeat 1 1  Language- follow 3 step command 3 3  Language- read & follow direction 1 1  Write a sentence 1 1  Copy design 0 1  Total score 25 28   Montreal Cognitive Assessment  04/12/2020    Visuospatial/ Executive (0/5) 5  Naming (0/3) 3  Attention: Read list of digits (0/2) 2  Attention: Read list of letters (0/1) 1  Attention: Serial 7 subtraction starting at 100 (0/3) 1  Language: Repeat phrase (0/2) 2  Language : Fluency (0/1) 1  Abstraction (0/2) 2  Delayed Recall (0/5) 0  Orientation (0/6) 3  Total 20    Neurological examination  Mentation: Alert oriented to time, place, is hard of hearing, most history is provided by her daughter, the patient is engaged and participatory during exam. Follows all commands speech and language fluent Cranial nerve II-XII: Pupils were equal round reactive to light. Extraocular movements were full, visual field were full on confrontational test. Facial sensation and strength were normal. Head turning and shoulder shrug  were normal and symmetric. Motor: The motor testing reveals 5 over 5 strength of all 4 extremities. Good symmetric motor tone is noted throughout.  Sensory: Sensory testing is intact to soft touch on all 4 extremities. No evidence of extinction is noted.  Coordination: Cerebellar testing reveals good finger-nose-finger and heel-to-shin bilaterally.  Gait and station: Able to rise from seated position without pushoff, gait is slightly wide-based, but steady, no assistive device, Romberg was negative, gait is unsteady. Reflexes: Deep tendon reflexes are symmetric but slightly increased in the knees  DIAGNOSTIC DATA (LABS, IMAGING, TESTING) - I reviewed patient records, labs, notes, testing and imaging myself where available.  Lab Results  Component Value Date   WBC 10.7 (H) 03/21/2020   HGB 15.5 (H) 03/21/2020   HCT 48.4 (H) 03/21/2020   MCV 102.3 (H) 03/21/2020   PLT 263 03/21/2020      Component Value Date/Time  NA 137 03/21/2020 1229   NA 142 07/07/2018 1025   K 3.7 03/21/2020 1229   CL 104 03/21/2020 1229   CO2 26 03/21/2020 1229   GLUCOSE 95 03/21/2020 1229   BUN 18 03/21/2020 1229   BUN 16 07/07/2018  1025   CREATININE 0.76 03/21/2020 1229   CALCIUM 8.4 (L) 03/21/2020 1229   PROT 6.7 03/21/2020 1229   PROT 6.0 07/07/2018 1025   ALBUMIN 3.8 03/21/2020 1229   ALBUMIN 4.2 07/07/2018 1025   AST 15 03/21/2020 1229   ALT 11 03/21/2020 1229   ALKPHOS 89 03/21/2020 1229   BILITOT 0.5 03/21/2020 1229   BILITOT 0.3 07/07/2018 1025   GFRNONAA >60 03/21/2020 1229   GFRAA >60 03/21/2020 1229   No results found for: CHOL, HDL, LDLCALC, LDLDIRECT, TRIG, CHOLHDL No results found for: HGBA1C Lab Results  Component Value Date   VITAMINB12 248 07/07/2018   Lab Results  Component Value Date   TSH 0.737 02/04/2013      ASSESSMENT AND PLAN 79 y.o. year old female  has a past medical history of Abnormality of gait (05/27/2013), Cancer ([place]), COPD (chronic obstructive pulmonary disease) ([place]), Depression, Diplopia, Epilepsy ([place]), Hearing deficit, High cholesterol, Lung cancer ([place]), Memory deficit (05/27/2013), Multiple sclerosis ([place]), Murmur, Scoliosis, Syncope, and Urine incontinence. here with:  1.  Multiple sclerosis, progressive, not on any DMT 2.  Gait disorder 3.  Memory disturbance 4.  Dissociative disorder  Memory remains overall stable, MMSE was 25/30.  She will remain on Aricept 5 mg daily, could not tolerate full dose.  We could consider Namenda, she recently had a psychiatric crisis with her dissociative disorder after stopping all of her medications.  She is now doing better.  She will be seeing her psychiatrist soon, we will consider starting Namenda if there is no psychiatric objection (daughter will let me know).  We will hold off on nerve conduction study that was ordered previously, the numbness and tingling are no longer an issue.  Also hold off on physical therapy, feels not needed currently.  She will follow-up in 6 months or sooner if needed.  I spent 30 minutes of face-to-face and non-face-to-face time with patient.  This included previsit chart review, lab review, study  review, order entry, electronic health record documentation, patient education.  Butler Denmark, AGNP-C, DNP 04/12/2020, 9:32 AM [facility] [address] [place], [place] [address] [phone]

## 2020-04-12 NOTE — Patient Instructions (Signed)
It was great to meet you both today! Continue Aricept at current dose Let's consider Namenda for her memory, discuss with you psychiatrist, let me know See you back in 6 months

## 2020-04-13 NOTE — Progress Notes (Signed)
I have read the note, and I agree with the clinical assessment and plan.  Bethannie Iglehart K Nili Honda   

## 2020-04-25 ENCOUNTER — Encounter: Payer: Self-pay | Admitting: Adult Health

## 2020-04-25 NOTE — Telephone Encounter (Signed)
Message should be placed in the patient's chart and not his wife's.

## 2020-04-25 NOTE — Telephone Encounter (Signed)
Left daughter, Stauffer a message to verify is message is regarding her mom or dad.

## 2020-04-25 NOTE — Telephone Encounter (Signed)
Parcell, pt's daughter, called back and said, the referral that needs to be fax to [facility] is regarding her mom.

## 2020-04-25 NOTE — Telephone Encounter (Signed)
Pt's daughter, Yowell, is calling regarding pt's referral that was done in March and April. Pt's daughter, Tavano, is asking, for a copy of the referral sent to [facility]. Fax [phone]. Per pt's daughter, Mcarthur Rossetti has lost the referral 2 times. Thanks

## 2020-06-14 ENCOUNTER — Encounter: Payer: Self-pay | Admitting: Adult Health

## 2020-06-28 ENCOUNTER — Other Ambulatory Visit: Payer: Self-pay

## 2020-06-28 ENCOUNTER — Ambulatory Visit: Payer: Medicare PPO | Admitting: Adult Health

## 2020-06-28 ENCOUNTER — Encounter: Payer: Self-pay | Admitting: Adult Health

## 2020-06-28 VITALS — BP 130/76 | Temp 98.4°F | Wt 125.0 lb

## 2020-06-28 DIAGNOSIS — E782 Mixed hyperlipidemia: Secondary | ICD-10-CM

## 2020-06-28 DIAGNOSIS — I739 Peripheral vascular disease, unspecified: Secondary | ICD-10-CM

## 2020-06-28 MED ORDER — ATORVASTATIN CALCIUM 20 MG PO TABS: 20.0000 mg | ORAL_TABLET | Freq: Every day | ORAL | 0 refills | Status: DC

## 2020-06-29 NOTE — Progress Notes (Signed)
Subjective:    Patient ID: [name], female    DOB: Aug 25, 1941, 79 y.o.   MRN: [id number]  HPI 79 year old female who  has a past medical history of Abnormality of gait (05/27/2013), Cancer ([place]), COPD (chronic obstructive pulmonary disease) ([place]), Depression, Diplopia, Epilepsy ([place]), Hearing deficit, High cholesterol, Lung cancer ([place]), Memory deficit (05/27/2013), Multiple sclerosis ([place]), Murmur, Scoliosis, Syncope, and Urine incontinence.  She presents to the office today with her daughter for concern of peripheral artery disease.  She had nurse practitioner with her insurance company make a home visit and it sounds like they did some ABIs which showed moderate stenosis in the right leg and significant stenosis in the left leg.  Patient is a former smoker, quit about a year and a half ago and over the age of 59.  She does have a history of hyperlipidemia and aortic atherosclerosis, was on Lipitor in the past but this was stopped for an unknown reason by her previous PCP or the patient prior to establishing care with me.  She denies chest pain, shortness of breath past baseline due to history of COPD, or cramping in her legs.  She does have numbness and tingling in her legs as well as will often feel as though her legs are fatigued with walking.   Review of Systems See HPI   Past Medical History:  Diagnosis Date   Abnormality of gait 05/27/2013   Cancer (HCC)    COPD (chronic obstructive pulmonary disease) (HCC)    Depression    Diplopia    Monocular, OS   Epilepsy (HCC)    Hearing deficit    High cholesterol    Lung cancer (HCC)    Memory deficit 05/27/2013   Multiple sclerosis ([place])    Murmur    Scoliosis    Syncope    Urine incontinence     Social History   Socioeconomic History   Marital status: Divorced    Spouse name: Not on file   Number of children: 1   Years of education: 36   Highest education level: Master's degree (e.g., MA, MS, MEng,  MEd, MSW, MBA)  Occupational History    Employer: RETIRED  Tobacco Use   Smoking status: Current Every Day Smoker    Packs/day: 0.25   Smokeless tobacco: Never Used  Vaping Use   Vaping Use: Never used  Substance and Sexual Activity   Alcohol use: No   Drug use: No   Sexual activity: Not on file  Other Topics Concern   Not on file  Social History Narrative   Not on file   Social Determinants of Health   Financial Resource Strain:    Difficulty of Paying Living Expenses:   Food Insecurity:    Worried About Charity fundraiser in the Last Year:    Arboriculturist in the Last Year:   Transportation Needs:    Film/video editor (Medical):    Lack of Transportation (Non-Medical):   Physical Activity:    Days of Exercise per Week:    Minutes of Exercise per Session:   Stress:    Feeling of Stress :   Social Connections:    Frequency of Communication with Friends and Family:    Frequency of Social Gatherings with Friends and Family:    Attends Religious Services:    Active Member of Clubs or Organizations:    Attends Archivist Meetings:    Marital Status:   Intimate  Partner Violence:    Fear of Current or Ex-Partner:    Emotionally Abused:    Physically Abused:    Sexually Abused:     Past Surgical History:  Procedure Laterality Date   ABDOMINAL HYSTERECTOMY  2007   [place]  2006   LUNG BIOPSY  2012   OVARIAN CYST REMOVAL  2008   Skin biopsy Left 12/08/2018   Superior Medial Anterior Ankle - Basal Cell Carcinoma, nodular pattern, ulcerated    Family History  Problem Relation Age of Onset   Stroke Mother    Heart disease Mother    Breast cancer Mother    Pancreatitis Mother    Alcohol abuse Mother    Arthritis Mother    Cancer Mother    Hyperlipidemia Mother    Mental illness Mother    Leukemia Father    Heart disease Father    Alcohol abuse  Father    Arthritis Father    Cancer Father    Alcohol abuse Sister    Arthritis Sister    COPD Sister    Depression Sister    Alcohol abuse Brother    Diabetes Brother    Kidney disease Brother    Heart disease Maternal Uncle    Diverticulitis Paternal Aunt    Heart disease Brother    Heart attack Brother    Hypertension Brother    Mental illness Brother    Heart attack Brother    Heart disease Brother    Hypertension Brother    Alcohol abuse Daughter    Cancer Daughter    COPD Daughter    Drug abuse Daughter     Allergies  Allergen Reactions   Dilantin [Phenytoin] Hives   Lactose Intolerance (Gi) Diarrhea   Shellfish Allergy Other (See Comments)    Childhood allergy--unknown reaction   Sulfa Antibiotics Hives    Current Outpatient Medications on File Prior to Visit  Medication Sig Dispense Refill   ARIPiprazole (ABILIFY) 2 MG tablet      aspirin EC 81 MG tablet Take 81 mg by mouth daily.     clonazePAM (KLONOPIN) 0.5 MG tablet Take 1 tablet (0.5 mg total) by mouth daily. (Patient taking differently: Take 0.5 mg by mouth 2 (two) times daily as needed for anxiety. ) 14 tablet 0   donepezil (ARICEPT) 5 MG tablet Take 1 tablet (5 mg total) by mouth at bedtime. 90 tablet 3   risperiDONE (RISPERDAL) 0.5 MG tablet      traZODone (DESYREL) 50 MG tablet Take 50 mg by mouth at bedtime.     venlafaxine XR (EFFEXOR-XR) 75 MG 24 hr capsule Take 1 capsule (75 mg total) by mouth daily. (Patient taking differently: daily. 3 capsules daily.) 30 capsule 0   No current facility-administered medications on file prior to visit.    BP 130/76    Temp 98.4 F (36.9 C)    Wt 125 lb (56.7 kg)    BMI 22.14 kg/m       Objective:   Physical Exam Vitals and nursing note reviewed.  Constitutional:      Appearance: Normal appearance.  Cardiovascular:     Rate and Rhythm: Normal rate and regular rhythm.     Pulses:          Popliteal pulses are 1+ on the  right side and 1+ on the left side.       Dorsalis pedis pulses are 1+ on the right  side and 2+ on the left side.       Posterior tibial pulses are 1+ on the right side and 1+ on the left side.     Heart sounds: Normal heart sounds.  Musculoskeletal:        General: No swelling or tenderness.     Right lower leg: No edema.     Left lower leg: No edema.  Skin:    General: Skin is warm and dry.     Capillary Refill: Capillary refill takes 2 to 3 seconds.     Comments: Purpleish discoloration of bilateral lower extremities while sitting, this improves when standing.  There is no swelling of the legs.  Toes are slightly cool to touch but does have decent cap refill.  Neurological:     General: No focal deficit present.     Mental Status: She is oriented to person, place, and time.  Psychiatric:        Mood and Affect: Mood normal.        Behavior: Behavior normal.        Thought Content: Thought content normal.        Judgment: Judgment normal.       Assessment & Plan:  1. PAD (peripheral artery disease) (HCC) - Place back on Lipitor as no one knows why this was stopped. Refer to vascular surgery for further evaluation  - atorvastatin (LIPITOR) 20 MG tablet; Take 1 tablet (20 mg total) by mouth daily.  Dispense: 90 tablet; Refill: 0 - Ambulatory referral to Vascular Surgery  2. Mixed hyperlipidemia  - atorvastatin (LIPITOR) 20 MG tablet; Take 1 tablet (20 mg total) by mouth daily.  Dispense: 90 tablet; Refill: 0  Dorothyann Peng, NP

## 2020-07-17 ENCOUNTER — Ambulatory Visit: Payer: Self-pay | Attending: Internal Medicine

## 2020-07-17 ENCOUNTER — Ambulatory Visit: Payer: Medicare PPO

## 2020-07-17 DIAGNOSIS — Z23 Encounter for immunization: Secondary | ICD-10-CM

## 2020-07-17 NOTE — Progress Notes (Signed)
° °  Covid-19 Vaccination Clinic  Name:  [name]    MRN: [id number] DOB: 08-Nov-1941  07/17/2020  [name] was observed post Covid-19 immunization for 15 minutes without incident. She was provided with Vaccine Information Sheet and instruction to access the V-Safe system.   [name] was instructed to call 911 with any severe reactions post vaccine:  Difficulty breathing   Swelling of face and throat   A fast heartbeat   A bad rash all over body   Dizziness and weakness

## 2020-08-17 ENCOUNTER — Other Ambulatory Visit: Payer: Self-pay

## 2020-08-17 DIAGNOSIS — I739 Peripheral vascular disease, unspecified: Secondary | ICD-10-CM

## 2020-09-04 ENCOUNTER — Ambulatory Visit (HOSPITAL_COMMUNITY)
Admission: RE | Admit: 2020-09-04 | Discharge: 2020-09-04 | Disposition: A | Discharge status: Home / Self Care | Payer: Medicare PPO | Source: Ambulatory Visit | Attending: Surgery | Admitting: Surgery

## 2020-09-04 ENCOUNTER — Encounter: Payer: Self-pay | Admitting: Surgery

## 2020-09-04 ENCOUNTER — Other Ambulatory Visit: Payer: Self-pay

## 2020-09-04 ENCOUNTER — Ambulatory Visit: Payer: Medicare PPO | Admitting: Surgery

## 2020-09-04 VITALS — BP 132/78 | HR 73 | Temp 97.8°F | Resp 20 | Ht 63.0 in | Wt 123.0 lb

## 2020-09-04 DIAGNOSIS — I739 Peripheral vascular disease, unspecified: Secondary | ICD-10-CM | POA: Diagnosis present

## 2020-09-04 DIAGNOSIS — I70213 Atherosclerosis of native arteries of extremities with intermittent claudication, bilateral legs: Secondary | ICD-10-CM

## 2020-09-04 NOTE — Progress Notes (Signed)
[facility] [place]  Patient name: [name] MRN: [id number] DOB: 04-22-1941 Sex: female   REQUESTING PROVIDER:    Dorothyann Peng   REASON FOR CONSULT:    PAD  HISTORY OF PRESENT ILLNESS:   [name] is a 79 y.o. female, who is referred for evaluation of peripheral vascular disease.  She notices that her feet turn blue and get very cold.  She does not have any open wounds.  She was able to heal a skin cancer resection from the medial malleolus approximately 2 years ago.  She denies claudication symptoms.  She denies rest pain.  The patient is a former smoker having quit 2 years ago.  She does have COPD.  She takes a statin for hypercholesterolemia.  She is on aspirin.  PAST MEDICAL HISTORY    Past Medical History:  Diagnosis Date   Abnormality of gait 05/27/2013   Cancer (HCC)    COPD (chronic obstructive pulmonary disease) (HCC)    Depression    Diplopia    Monocular, OS   Epilepsy ([place])    Hearing deficit    High cholesterol    Lung cancer ([place])    Memory deficit 05/27/2013   Multiple sclerosis ([place])    Murmur    Scoliosis    Syncope    Urine incontinence      FAMILY HISTORY   Family History  Problem Relation Age of Onset   Stroke Mother    Heart disease Mother    Breast cancer Mother    Pancreatitis Mother    Alcohol abuse Mother    Arthritis Mother    Cancer Mother    Hyperlipidemia Mother    Mental illness Mother    Leukemia Father    Heart disease Father    Alcohol abuse Father    Arthritis Father    Cancer Father    Alcohol abuse Sister    Arthritis Sister    COPD Sister    Depression Sister    Alcohol abuse Brother    Diabetes Brother    Kidney disease Brother    Heart disease Maternal Uncle    Diverticulitis Paternal Aunt    Heart disease Brother    Heart attack Brother    Hypertension Brother    Mental illness Brother     Heart attack Brother    Heart disease Brother    Hypertension Brother    Alcohol abuse Daughter    Cancer Daughter    COPD Daughter    Drug abuse Daughter     SOCIAL HISTORY:   Social History   Socioeconomic History   Marital status: Divorced    Spouse name: Not on file   Number of children: 1   Years of education: 84   Highest education level: Conservator, museum/gallery (e.g., MA, MS, MEng, MEd, MSW, MBA)  Occupational History    Employer: RETIRED  Tobacco Use   Smoking status: Former Smoker    Packs/day: 0.25   Smokeless tobacco: Never Used  Scientific laboratory technician Use: Never used  Substance and Sexual Activity   Alcohol use: No   Drug use: No   Sexual activity: Not on file  Other Topics Concern   Not on file  Social History Narrative   Not on file   Social Determinants of Health   Financial Resource Strain:    Difficulty of Paying Living Expenses: Not on file  Food Insecurity:    Worried About Running Out of  Food in the Last Year: Not on file   Ran Out of Food in the Last Year: Not on file  Transportation Needs:    Lack of Transportation (Medical): Not on file   Lack of Transportation (Non-Medical): Not on file  Physical Activity:    Days of Exercise per Week: Not on file   Minutes of Exercise per Session: Not on file  Stress:    Feeling of Stress : Not on file  Social Connections:    Frequency of Communication with Friends and Family: Not on file   Frequency of Social Gatherings with Friends and Family: Not on file   Attends Religious Services: Not on file   Active Member of [place] or Organizations: Not on file   Attends Archivist Meetings: Not on file   Marital Status: Not on file  Intimate Partner Violence:    Fear of Current or Ex-Partner: Not on file   Emotionally Abused: Not on file   Physically Abused: Not on file   Sexually Abused: Not on file    ALLERGIES:    Allergies  Allergen Reactions   Dilantin  [Phenytoin] Hives   Lactose Intolerance (Gi) Diarrhea   Shellfish Allergy Other (See Comments)    Childhood allergy--unknown reaction   Sulfa Antibiotics Hives    CURRENT MEDICATIONS:    Current Outpatient Medications  Medication Sig Dispense Refill   ARIPiprazole (ABILIFY) 2 MG tablet      aspirin EC 81 MG tablet Take 81 mg by mouth daily.     atorvastatin (LIPITOR) 20 MG tablet Take 1 tablet (20 mg total) by mouth daily. 90 tablet 0   clonazePAM (KLONOPIN) 0.5 MG tablet Take 1 tablet (0.5 mg total) by mouth daily. (Patient taking differently: Take 0.5 mg by mouth 2 (two) times daily as needed for anxiety. ) 14 tablet 0   donepezil (ARICEPT) 5 MG tablet Take 1 tablet (5 mg total) by mouth at bedtime. 90 tablet 3   risperiDONE (RISPERDAL) 0.5 MG tablet      traZODone (DESYREL) 50 MG tablet Take 50 mg by mouth at bedtime.     venlafaxine XR (EFFEXOR-XR) 75 MG 24 hr capsule Take 1 capsule (75 mg total) by mouth daily. (Patient taking differently: daily. 3 capsules daily.) 30 capsule 0   No current facility-administered medications for this visit.    REVIEW OF SYSTEMS:   [X]  denotes positive finding, [ ]  denotes negative finding Cardiac  Comments:  Chest pain or chest pressure:    Shortness of breath upon exertion: x   Short of breath when lying flat:    Irregular heart rhythm:        Vascular    Pain in calf, thigh, or hip brought on by ambulation: x   Pain in feet at night that wakes you up from your sleep:     Blood clot in your veins:    Leg swelling:         Pulmonary    Oxygen at home:    Productive cough:     Wheezing:         Neurologic    Sudden weakness in arms or legs:     Sudden numbness in arms or legs:     Sudden onset of difficulty speaking or slurred speech:    Temporary loss of vision in one eye:     Problems with dizziness:         Gastrointestinal    Blood in stool:  Vomited blood:         Genitourinary    Burning when urinating:      Blood in urine:        Psychiatric    Major depression:         Hematologic    Bleeding problems:    Problems with blood clotting too easily:        Skin    Rashes or ulcers:        Constitutional    Fever or chills:     PHYSICAL EXAM:   Vitals:   09/04/20 0931  BP: 132/78  Pulse: 73  Resp: 20  Temp: 97.8 F (36.6 C)  SpO2: 92%  Weight: 123 lb (55.8 kg)  Height: 5\' 3"  (1.6 m)    GENERAL: The patient is a well-nourished female, in no acute distress. The vital signs are documented above. CARDIAC: There is a regular rate and rhythm.  VASCULAR: Nonpalpable pedal pulses PULMONARY: Nonlabored respirations ABDOMEN: Soft and non-tender with normal pitched bowel sounds.  MUSCULOSKELETAL: There are no major deformities or cyanosis. NEUROLOGIC: No focal weakness or paresthesias are detected. SKIN: There are no ulcers or rashes noted. PSYCHIATRIC: The patient has a normal affect.  STUDIES:   I have reviewed the following:  ABI/TBI Today's ABI Today's TBI Previous ABI Previous TBI   +-------+-----------+-----------+------------+------------+   Right  0.88     0.66                     +-------+-----------+-----------+------------+------------+   Left   0.78     0.68                     +-------+-----------+-----------+------------+------------+   Waveforms are triphasic  Right toe pressure:  107 Left toe pressure:  110  ASSESSMENT and PLAN   Lower extremity peripheral vascular disease: The patient's ABIs are in the near normal range.  She is asymptomatic.  At this point we discussed the importance of medical management which include taking a baby aspirin, a statin, regular exercise and healthy eating.  She will follow-up with me in 1 year with repeat ABIs.   Leia Alf, MD, FACS [facility] [phone] Pager [phone]

## 2020-09-06 ENCOUNTER — Telehealth: Payer: Self-pay | Admitting: Adult Health

## 2020-09-06 NOTE — Telephone Encounter (Signed)
Left message for patient to schedule Annual Wellness Visit.  Please schedule with Nurse Health Advisor Shannon Crews, RN at  [facility]  

## 2020-09-26 ENCOUNTER — Other Ambulatory Visit: Payer: Self-pay | Admitting: Adult Health

## 2020-09-26 DIAGNOSIS — E782 Mixed hyperlipidemia: Secondary | ICD-10-CM

## 2020-09-26 DIAGNOSIS — I739 Peripheral vascular disease, unspecified: Secondary | ICD-10-CM

## 2020-10-11 ENCOUNTER — Ambulatory Visit: Payer: Medicare PPO | Admitting: Neurology

## 2020-12-11 ENCOUNTER — Ambulatory Visit: Payer: Medicare PPO | Admitting: Neurology

## 2021-01-07 ENCOUNTER — Encounter: Payer: Self-pay | Admitting: Adult Health

## 2021-01-07 DIAGNOSIS — E782 Mixed hyperlipidemia: Secondary | ICD-10-CM

## 2021-01-07 DIAGNOSIS — I739 Peripheral vascular disease, unspecified: Secondary | ICD-10-CM

## 2021-01-09 MED ORDER — ATORVASTATIN CALCIUM 20 MG PO TABS: 20.0000 mg | ORAL_TABLET | Freq: Every day | ORAL | 0 refills | Status: DC

## 2021-01-10 ENCOUNTER — Ambulatory Visit: Payer: Medicare PPO | Admitting: Neurology

## 2021-01-16 ENCOUNTER — Ambulatory Visit: Payer: Medicare PPO

## 2021-01-29 ENCOUNTER — Ambulatory Visit: Payer: Medicare PPO

## 2021-01-30 ENCOUNTER — Ambulatory Visit: Payer: Medicare PPO

## 2021-02-08 NOTE — Progress Notes (Deleted)
Subjective:   [name] is a 80 y.o. female who presents for Medicare Annual (Subsequent) preventive examination.  Review of Systems    N/A        Objective:    There were no vitals filed for this visit. There is no height or weight on file to calculate BMI.  Advanced Directives 03/21/2020 03/21/2020 02/04/2013  Does Patient Have a Medical Advance Directive? No Unable to assess, patient is non-responsive or altered mental status Patient has advance directive, copy not in chart  Type of Advance Directive - - [place];Living will  Copy of [place] in Chart? - - Copy requested from family  Would patient like information on creating a medical advance directive? No - Patient declined;No - Guardian declined - -  Pre-existing out of facility DNR order (yellow form or pink MOST form) - - No    Current Medications (verified) Outpatient Encounter Medications as of 02/09/2021  Medication Sig  . ARIPiprazole (ABILIFY) 2 MG tablet   . aspirin EC 81 MG tablet Take 81 mg by mouth daily.  Marland Kitchen atorvastatin (LIPITOR) 20 MG tablet Take 1 tablet (20 mg total) by mouth daily. Need for schedule yearly physical for further refills  . clonazePAM (KLONOPIN) 0.5 MG tablet Take 1 tablet (0.5 mg total) by mouth daily. (Patient taking differently: Take 0.5 mg by mouth 2 (two) times daily as needed for anxiety. )  . donepezil (ARICEPT) 5 MG tablet Take 1 tablet (5 mg total) by mouth at bedtime.  . risperiDONE (RISPERDAL) 0.5 MG tablet   . traZODone (DESYREL) 50 MG tablet Take 50 mg by mouth at bedtime.  Marland Kitchen venlafaxine XR (EFFEXOR-XR) 75 MG 24 hr capsule Take 1 capsule (75 mg total) by mouth daily. (Patient taking differently: daily. 3 capsules daily.)   No facility-administered encounter medications on file as of 02/09/2021.    Allergies (verified) Dilantin [phenytoin], Lactose intolerance (gi), Shellfish allergy, and Sulfa antibiotics   History: Past Medical  History:  Diagnosis Date  . Abnormality of gait 05/27/2013  . Cancer ([place])   . COPD (chronic obstructive pulmonary disease) ([place])   . Depression   . Diplopia    Monocular, OS  . Epilepsy ([place])   . Hearing deficit   . High cholesterol   . Lung cancer ([place])   . Memory deficit 05/27/2013  . Multiple sclerosis ([place])   . Murmur   . Scoliosis   . Syncope   . Urine incontinence    Past Surgical History:  Procedure Laterality Date  . ABDOMINAL HYSTERECTOMY  2007  . APPENDECTOMY  1952  . CESAREAN SECTION  1980  . CHOLECYSTECTOMY  2006  . LUNG BIOPSY  2012  . OVARIAN CYST REMOVAL  2008  . Skin biopsy Left 12/08/2018   Superior Medial Anterior Ankle - Basal Cell Carcinoma, nodular pattern, ulcerated   Family History  Problem Relation Age of Onset  . Stroke Mother   . Heart disease Mother   . Breast cancer Mother   . Pancreatitis Mother   . Alcohol abuse Mother   . Arthritis Mother   . Cancer Mother   . Hyperlipidemia Mother   . Mental illness Mother   . Leukemia Father   . Heart disease Father   . Alcohol abuse Father   . Arthritis Father   . Cancer Father   . Alcohol abuse Sister   . Arthritis Sister   . COPD Sister   . Depression Sister   .  Alcohol abuse Brother   . Diabetes Brother   . Kidney disease Brother   . Heart disease Maternal Uncle   . Diverticulitis Paternal Aunt   . Heart disease Brother   . Heart attack Brother   . Hypertension Brother   . Mental illness Brother   . Heart attack Brother   . Heart disease Brother   . Hypertension Brother   . Alcohol abuse Daughter   . Cancer Daughter   . COPD Daughter   . Drug abuse Daughter    Social History   Socioeconomic History  . Marital status: Divorced    Spouse name: Not on file  . Number of children: 1  . Years of education: 51  . Highest education level: Master's degree (e.g., MA, MS, MEng, MEd, MSW, MBA)  Occupational History    Employer: RETIRED  Tobacco Use  . Smoking status: Former Smoker     Packs/day: 0.25  . Smokeless tobacco: Never Used  Vaping Use  . Vaping Use: Never used  Substance and Sexual Activity  . Alcohol use: No  . Drug use: No  . Sexual activity: Not on file  Other Topics Concern  . Not on file  Social History Narrative  . Not on file   Social Determinants of Health   Financial Resource Strain: Not on file  Food Insecurity: Not on file  Transportation Needs: Not on file  Physical Activity: Not on file  Stress: Not on file  Social Connections: Not on file    Tobacco Counseling Counseling given: Not Answered   Clinical Intake:                 Diabetic?No          Activities of Daily Living In your present state of health, do you have any difficulty performing the following activities: 03/21/2020  Hearing? Y  Comment some difficulty hearing  Vision? N  Difficulty concentrating or making decisions? Y  Walking or climbing stairs? Y  Comment walks in home, sometimes holds railings  Dressing or bathing? Y  Some recent data might be hidden    Patient Care Team: Dorothyann Peng, NP as PCP - General (Family Medicine) Dorann Ou, MD as Referring Physician (Pulmonary Disease) Pearson Grippe, MD as Referring Physician (Psychiatry) Harriett Sine, MD as Consulting Physician (Dermatology)  Indicate any recent Medical Services you may have received from other than [facility] providers in the past year (date may be approximate).     Assessment:   This is a routine wellness examination for [facility].  Hearing/Vision screen No exam data present  Dietary issues and exercise activities discussed:    Goals   None    Depression Screen No flowsheet data found.  Fall Risk No flowsheet data found.  FALL RISK PREVENTION PERTAINING TO THE HOME:  Any stairs in or around the home? {YES/NO:21197} If so, are there any without handrails? No  Home free of loose throw rugs in walkways, pet beds, electrical cords, etc? Yes  Adequate  lighting in your home to reduce risk of falls? Yes   ASSISTIVE DEVICES UTILIZED TO PREVENT FALLS:  Life alert? {YES/NO:21197} Use of a cane, walker or w/c? {YES/NO:21197} Grab bars in the bathroom? {YES/NO:21197} Shower chair or bench in shower? {YES/NO:21197} Elevated toilet seat or a handicapped toilet? {YES/NO:21197}    Cognitive Function: MMSE - Mini Mental State Exam 04/12/2020 07/07/2018  Orientation to time 4 5  Orientation to Place 5 5  Registration 3 3  Attention/ Calculation 2  3  Recall 3 3  Language- name 2 objects 2 2  Language- repeat 1 1  Language- follow 3 step command 3 3  Language- read & follow direction 1 1  Write a sentence 1 1  Copy design 0 1  Total score 25 28   Montreal Cognitive Assessment  04/12/2020  Visuospatial/ Executive (0/5) 5  Naming (0/3) 3  Attention: Read list of digits (0/2) 2  Attention: Read list of letters (0/1) 1  Attention: Serial 7 subtraction starting at 100 (0/3) 1  Language: Repeat phrase (0/2) 2  Language : Fluency (0/1) 1  Abstraction (0/2) 2  Delayed Recall (0/5) 0  Orientation (0/6) 3  Total 20      Immunizations Immunization History  Administered Date(s) Administered  . Influenza, High Dose Seasonal PF 09/28/2018  . Influenza-Unspecified 08/28/2010, 09/15/2013  . PFIZER(Purple Top)SARS-COV-2 Vaccination 12/10/2019, 12/29/2019, 07/17/2020  . Zoster 09/15/2013    TDAP status: Due, Education has been provided regarding the importance of this vaccine. Advised may receive this vaccine at local pharmacy or Health Dept. Aware to provide a copy of the vaccination record if obtained from local pharmacy or Health Dept. Verbalized acceptance and understanding.  Flu Vaccine status: Due, Education has been provided regarding the importance of this vaccine. Advised may receive this vaccine at local pharmacy or Health Dept. Aware to provide a copy of the vaccination record if obtained from local pharmacy or Health Dept. Verbalized  acceptance and understanding.  Pneumococcal vaccine status: Due, Education has been provided regarding the importance of this vaccine. Advised may receive this vaccine at local pharmacy or Health Dept. Aware to provide a copy of the vaccination record if obtained from local pharmacy or Health Dept. Verbalized acceptance and understanding.  Covid-19 vaccine status: Completed vaccines  Qualifies for Shingles Vaccine? Yes   Zostavax completed Yes   Shingrix Completed?: No.    Education has been provided regarding the importance of this vaccine. Patient has been advised to call insurance company to determine out of pocket expense if they have not yet received this vaccine. Advised may also receive vaccine at local pharmacy or Health Dept. Verbalized acceptance and understanding.  Screening Tests Health Maintenance  Topic Date Due  . Hepatitis C Screening  Never done  . TETANUS/TDAP  Never done  . DEXA SCAN  Never done  . PNA vac Low Risk Adult (1 of 2 - PCV13) Never done  . INFLUENZA VACCINE  06/25/2020  . COVID-19 Vaccine (4 - Booster for Pfizer series) 01/17/2021  . HPV VACCINES  Aged Out    Health Maintenance  Health Maintenance Due  Topic Date Due  . Hepatitis C Screening  Never done  . TETANUS/TDAP  Never done  . DEXA SCAN  Never done  . PNA vac Low Risk Adult (1 of 2 - PCV13) Never done  . INFLUENZA VACCINE  06/25/2020  . COVID-19 Vaccine (4 - Booster for Pfizer series) 01/17/2021    Colorectal cancer screening: No longer required.   Mammogram status: Ordered 02/09/2021. Pt provided with contact info and advised to call to schedule appt.   Bone Density status: Ordered 02/09/2021. Pt provided with contact info and advised to call to schedule appt.  Lung Cancer Screening: (Low Dose CT Chest recommended if Age 32-80 years, 30 pack-year currently smoking OR have quit w/in 15years.) does not qualify.   Lung Cancer Screening Referral: N/A   Additional Screening:  Hepatitis C  Screening: does qualify;   Vision Screening: Recommended annual ophthalmology  exams for early detection of glaucoma and other disorders of the eye. Is the patient up to date with their annual eye exam?  {YES/NO:21197} Who is the provider or what is the name of the office in which the patient attends annual eye exams? *** If pt is not established with a provider, would they like to be referred to a provider to establish care? {YES/NO:21197}.   Dental Screening: Recommended annual dental exams for proper oral hygiene  Community Resource Referral / Chronic Care Management: CRR required this visit?  No   CCM required this visit?  No      Plan:     I have personally reviewed and noted the following in the patient's chart:   . Medical and social history . Use of alcohol, tobacco or illicit drugs  . Current medications and supplements . Functional ability and status . Nutritional status . Physical activity . Advanced directives . List of other physicians . Hospitalizations, surgeries, and ER visits in previous 12 months . Vitals . Screenings to include cognitive, depression, and falls . Referrals and appointments  In addition, I have reviewed and discussed with patient certain preventive protocols, quality metrics, and best practice recommendations. A written personalized care plan for preventive services as well as general preventive health recommendations were provided to patient.     Ofilia Neas, LPN   0/71/2197   Nurse Notes: None

## 2021-02-09 ENCOUNTER — Ambulatory Visit: Payer: Medicare PPO

## 2021-02-09 DIAGNOSIS — Z0000 Encounter for general adult medical examination without abnormal findings: Secondary | ICD-10-CM

## 2021-02-09 NOTE — Progress Notes (Signed)
Erroneous Encounter

## 2021-02-21 ENCOUNTER — Encounter: Payer: Medicare PPO | Admitting: Adult Health

## 2021-02-28 DIAGNOSIS — J449 Chronic obstructive pulmonary disease, unspecified: Secondary | ICD-10-CM | POA: Diagnosis not present

## 2021-03-16 ENCOUNTER — Encounter: Payer: Medicare PPO | Admitting: Adult Health

## 2021-03-25 ENCOUNTER — Encounter: Payer: Self-pay | Admitting: Neurology

## 2021-03-26 ENCOUNTER — Ambulatory Visit: Payer: Medicare PPO | Admitting: Neurology

## 2021-03-30 DIAGNOSIS — J449 Chronic obstructive pulmonary disease, unspecified: Secondary | ICD-10-CM | POA: Diagnosis not present

## 2021-04-11 ENCOUNTER — Ambulatory Visit: Payer: Medicare PPO | Admitting: Neurology

## 2021-04-16 ENCOUNTER — Encounter: Payer: Self-pay | Admitting: Adult Health

## 2021-04-20 ENCOUNTER — Encounter: Payer: Medicare PPO | Admitting: Adult Health

## 2021-04-30 DIAGNOSIS — J449 Chronic obstructive pulmonary disease, unspecified: Secondary | ICD-10-CM | POA: Diagnosis not present

## 2021-05-02 ENCOUNTER — Other Ambulatory Visit: Payer: Self-pay

## 2021-05-03 ENCOUNTER — Encounter: Payer: Self-pay | Admitting: Adult Health

## 2021-05-03 ENCOUNTER — Other Ambulatory Visit: Payer: Self-pay | Admitting: Adult Health

## 2021-05-03 ENCOUNTER — Ambulatory Visit (INDEPENDENT_AMBULATORY_CARE_PROVIDER_SITE_OTHER): Payer: Medicare PPO | Admitting: Adult Health

## 2021-05-03 VITALS — BP 110/80 | HR 72 | Temp 99.0°F | Ht 63.0 in | Wt 127.0 lb

## 2021-05-03 DIAGNOSIS — E039 Hypothyroidism, unspecified: Secondary | ICD-10-CM

## 2021-05-03 DIAGNOSIS — R413 Other amnesia: Secondary | ICD-10-CM

## 2021-05-03 DIAGNOSIS — J449 Chronic obstructive pulmonary disease, unspecified: Secondary | ICD-10-CM | POA: Diagnosis not present

## 2021-05-03 DIAGNOSIS — F3289 Other specified depressive episodes: Secondary | ICD-10-CM | POA: Diagnosis not present

## 2021-05-03 DIAGNOSIS — Z0000 Encounter for general adult medical examination without abnormal findings: Secondary | ICD-10-CM

## 2021-05-03 DIAGNOSIS — E782 Mixed hyperlipidemia: Secondary | ICD-10-CM | POA: Diagnosis not present

## 2021-05-03 LAB — CBC WITH DIFFERENTIAL/PLATELET
Basophils Absolute: 0 10*3/uL (ref 0.0–0.1)
Basophils Relative: 0.5 % (ref 0.0–3.0)
Eosinophils Absolute: 0.2 10*3/uL (ref 0.0–0.7)
Eosinophils Relative: 2.6 % (ref 0.0–5.0)
HCT: 44 % (ref 36.0–46.0)
Hemoglobin: 14.9 g/dL (ref 12.0–15.0)
Lymphocytes Relative: 24.2 % (ref 12.0–46.0)
Lymphs Abs: 2 10*3/uL (ref 0.7–4.0)
MCHC: 33.8 g/dL (ref 30.0–36.0)
MCV: 96.9 fl (ref 78.0–100.0)
Monocytes Absolute: 0.7 10*3/uL (ref 0.1–1.0)
Monocytes Relative: 8.2 % (ref 3.0–12.0)
Neutro Abs: 5.3 10*3/uL (ref 1.4–7.7)
Neutrophils Relative %: 64.5 % (ref 43.0–77.0)
Platelets: 241 10*3/uL (ref 150.0–400.0)
RBC: 4.54 Mil/uL (ref 3.87–5.11)
RDW: 13.6 % (ref 11.5–15.5)
WBC: 8.3 10*3/uL (ref 4.0–10.5)

## 2021-05-03 LAB — COMPREHENSIVE METABOLIC PANEL
ALT: 11 U/L (ref 0–35)
AST: 17 U/L (ref 0–37)
Albumin: 4.1 g/dL (ref 3.5–5.2)
Alkaline Phosphatase: 128 U/L — ABNORMAL HIGH (ref 39–117)
BUN: 14 mg/dL (ref 6–23)
CO2: 30 mEq/L (ref 19–32)
Calcium: 8.9 mg/dL (ref 8.4–10.5)
Chloride: 104 mEq/L (ref 96–112)
Creatinine, Ser: 0.82 mg/dL (ref 0.40–1.20)
GFR: 68.49 mL/min (ref >60.00)
Glucose, Bld: 101 mg/dL — ABNORMAL HIGH (ref 70–99)
Potassium: 3.5 mEq/L (ref 3.5–5.1)
Sodium: 141 mEq/L (ref 135–145)
Total Bilirubin: 0.5 mg/dL (ref 0.2–1.2)
Total Protein: 6.7 g/dL (ref 6.0–8.3)

## 2021-05-03 LAB — LIPID PANEL
Cholesterol: 165 mg/dL (ref 0–200)
HDL: 58.5 mg/dL (ref >39.00)
LDL Cholesterol: 90 mg/dL (ref 0–99)
NonHDL: 106.33
Total CHOL/HDL Ratio: 3
Triglycerides: 82 mg/dL (ref 0.0–149.0)
VLDL: 16.4 mg/dL (ref 0.0–40.0)

## 2021-05-03 LAB — TSH: TSH: 19.94 u[IU]/mL — ABNORMAL HIGH (ref 0.35–4.50)

## 2021-05-03 LAB — HEMOGLOBIN A1C: Hgb A1c MFr Bld: 5.7 % (ref 4.6–6.5)

## 2021-05-03 NOTE — Progress Notes (Signed)
Subjective:    Patient ID: [name], female    DOB: 1941-01-12, 80 y.o.   MRN: [id number]  HPI Patient presents for yearly preventative medicine examination. She is is a pleasant 80 year old female who  has a past medical history of Abnormality of gait (05/27/2013), Cancer ([place]), COPD (chronic obstructive pulmonary disease) ([place]), Depression, Diplopia, Epilepsy ([place]), Hearing deficit, High cholesterol, Lung cancer ([place]), Memory deficit (05/27/2013), Multiple sclerosis ([place]), Murmur, Scoliosis, Syncope, and Urine incontinence.  Her daughter is with her today to help with history   COPD -followed by pulmonary.  Has been prescribed inhalers in the past such as albuterol and oral Ellipta, but stopped these on her own and does not wish to have any inhalers in her regimen. Does report DOE but does not feel as though it has gotten worse  MS-proggressive form.  Is followed by neurology.  Not on any DMT  Cognitive Impairment -last MMSE was 25 out of 30 in 2021.  She is on Aricept 5 mg daily, could not tolerate full dose. Sees neurology next month. Daughter reports sun downing and confusion. Patient will sleep a lot, sometimes for two days straight.  Depression -seen by psychiatry every 3 months.  Currently prescribed Effexor 225mg , trazodone 50 mg, and Klonopin 0.5 mg BID, and Abilify 2 mg daily as well as Seroquel 50 mg QHS.   Hyperlipidemia - takes Lipitor 20 mg daily and baby ASA  All immunizations and health maintenance protocols were reviewed with the patient and needed orders were placed.  Appropriate screening laboratory values were ordered for the patient including screening of hyperlipidemia, renal function and hepatic function.   Medication reconciliation,  past medical history, social history, problem list and allergies were reviewed in detail with the patient  Goals were established with regard to weight loss, exercise, and  diet in compliance with medications  Review of Systems   Constitutional: Negative.   HENT: Negative.    Eyes: Negative.   Respiratory: Negative.    Cardiovascular: Negative.   Gastrointestinal: Negative.   Endocrine: Negative.   Genitourinary: Negative.   Musculoskeletal: Negative.   Skin: Negative.   Psychiatric/Behavioral:  Positive for confusion and sleep disturbance.   All other systems reviewed and are negative. Past Medical History:  Diagnosis Date   Abnormality of gait 05/27/2013   Cancer (HCC)    COPD (chronic obstructive pulmonary disease) (HCC)    Depression    Diplopia    Monocular, OS   Epilepsy (HCC)    Hearing deficit    High cholesterol    Lung cancer (HCC)    Memory deficit 05/27/2013   Multiple sclerosis ([place])    Murmur    Scoliosis    Syncope    Urine incontinence     Social History   Socioeconomic History   Marital status: Divorced    Spouse name: Not on file   Number of children: 1   Years of education: 6   Highest education level: Master's degree (e.g., MA, MS, MEng, MEd, MSW, MBA)  Occupational History    Employer: RETIRED  Tobacco Use   Smoking status: Former    Packs/day: 0.25    Pack years: 0.00    Types: Cigarettes   Smokeless tobacco: Never  Vaping Use   Vaping Use: Never used  Substance and Sexual Activity   Alcohol use: No   Drug use: No   Sexual activity: Not on file  Other Topics Concern   Not on file  Social  History Narrative   Not on file   Social Determinants of Health   Financial Resource Strain: Not on file  Food Insecurity: Not on file  Transportation Needs: Not on file  Physical Activity: Not on file  Stress: Not on file  Social Connections: Not on file  Intimate Partner Violence: Not on file    Past Surgical History:  Procedure Laterality Date   ABDOMINAL HYSTERECTOMY  2007   [place]  2006   LUNG BIOPSY  2012   OVARIAN CYST REMOVAL  2008   Skin biopsy Left 12/08/2018   Superior Medial Anterior Ankle -  Basal Cell Carcinoma, nodular pattern, ulcerated    Family History  Problem Relation Age of Onset   Stroke Mother    Heart disease Mother    Breast cancer Mother    Pancreatitis Mother    Alcohol abuse Mother    Arthritis Mother    Cancer Mother    Hyperlipidemia Mother    Mental illness Mother    Leukemia Father    Heart disease Father    Alcohol abuse Father    Arthritis Father    Cancer Father    Alcohol abuse Sister    Arthritis Sister    COPD Sister    Depression Sister    Alcohol abuse Brother    Diabetes Brother    Kidney disease Brother    Heart disease Maternal Uncle    Diverticulitis Paternal Aunt    Heart disease Brother    Heart attack Brother    Hypertension Brother    Mental illness Brother    Heart attack Brother    Heart disease Brother    Hypertension Brother    Alcohol abuse Daughter    Cancer Daughter    COPD Daughter    Drug abuse Daughter     Allergies  Allergen Reactions   Dilantin [Phenytoin] Hives   Lactose Intolerance (Gi) Diarrhea   Shellfish Allergy Other (See Comments)    Childhood allergy--unknown reaction   Sulfa Antibiotics Hives    Current Outpatient Medications on File Prior to Visit  Medication Sig Dispense Refill   ARIPiprazole (ABILIFY) 2 MG tablet      aspirin EC 81 MG tablet Take 81 mg by mouth daily.     atorvastatin (LIPITOR) 20 MG tablet Take 1 tablet (20 mg total) by mouth daily. Need for schedule yearly physical for further refills 90 tablet 0   clonazePAM (KLONOPIN) 0.5 MG tablet Take 1 tablet (0.5 mg total) by mouth daily. (Patient taking differently: Take 0.5 mg by mouth 2 (two) times daily as needed for anxiety.) 14 tablet 0   donepezil (ARICEPT) 5 MG tablet Take 1 tablet (5 mg total) by mouth at bedtime. 90 tablet 3   risperiDONE (RISPERDAL) 0.5 MG tablet      traZODone (DESYREL) 50 MG tablet Take 100 mg by mouth at bedtime.     venlafaxine XR (EFFEXOR-XR) 75 MG 24 hr capsule Take 1 capsule (75 mg total) by  mouth daily. (Patient taking differently: daily. 3 capsules daily.) 30 capsule 0   No current facility-administered medications on file prior to visit.    BP 110/80   Pulse 72   Temp 99 F (37.2 C) (Oral)   Ht 5\' 3"  (1.6 m)   Wt 127 lb (57.6 kg)   SpO2 92%   BMI 22.50 kg/m       Objective:   Physical Exam Constitutional:  Appearance: Normal appearance.  HENT:     Mouth/Throat:     Lips: Pink.     Dentition: Abnormal dentition. Dental caries present.     Pharynx: Oropharynx is clear. Uvula midline.     Comments: Most of her remaining teeth are broken   Eyes:     Extraocular Movements: Extraocular movements intact.     Pupils: Pupils are equal, round, and reactive to light.  Cardiovascular:     Rate and Rhythm: Normal rate and regular rhythm.     Pulses: Normal pulses.     Heart sounds: Normal heart sounds.  Pulmonary:     Effort: Pulmonary effort is normal.     Breath sounds: Normal breath sounds.  Abdominal:     General: Abdomen is flat.     Palpations: Abdomen is soft.  Musculoskeletal:        General: Normal range of motion.  Skin:    General: Skin is warm and dry.     Capillary Refill: Capillary refill takes less than 2 seconds.  Neurological:     General: No focal deficit present.     Mental Status: She is alert. Mental status is at baseline.  Psychiatric:        Mood and Affect: Mood normal.        Behavior: Behavior normal.       Assessment & Plan:   1. Routine general medical examination at a health care facility  - CBC with Differential/Platelet; Future - Comprehensive metabolic panel; Future - Lipid panel; Future - TSH; Future - Hemoglobin A1c; Future - Hemoglobin A1c - TSH - Lipid panel - Comprehensive metabolic panel - CBC with Differential/Platelet  2. Mixed hyperlipidemia - Consider increase in statin  - CBC with Differential/Platelet; Future - Comprehensive metabolic panel; Future - Lipid panel; Future - TSH; Future -  Hemoglobin A1c; Future - Hemoglobin A1c - TSH - Lipid panel - Comprehensive metabolic panel - CBC with Differential/Platelet  3. Other depression - Speak with psychiatrist. She is on a lot of medication that can make her drowsy. Consider d/c Trazodone.  - CBC with Differential/Platelet; Future - Comprehensive metabolic panel; Future - Lipid panel; Future - TSH; Future - Hemoglobin A1c; Future - Hemoglobin A1c - TSH - Lipid panel - Comprehensive metabolic panel - CBC with Differential/Platelet  4. Memory deficit - Continue Arcept - Follow up with neurology as directed  5. Chronic obstructive pulmonary disease, unspecified COPD type ([place]) - Follow up with pulmonary as directed  Dorothyann Peng, NP

## 2021-05-03 NOTE — Patient Instructions (Signed)
It was great seeing you today   We will follow up with you regarding your blood work   Please follow up in a year or sooner if needed

## 2021-05-04 ENCOUNTER — Other Ambulatory Visit: Payer: Self-pay

## 2021-05-04 ENCOUNTER — Telehealth: Payer: Self-pay | Admitting: Adult Health

## 2021-05-04 DIAGNOSIS — I739 Peripheral vascular disease, unspecified: Secondary | ICD-10-CM

## 2021-05-04 DIAGNOSIS — E782 Mixed hyperlipidemia: Secondary | ICD-10-CM

## 2021-05-04 MED ORDER — LEVOTHYROXINE SODIUM 50 MCG PO TABS: 50.0000 ug | ORAL_TABLET | Freq: Every day | ORAL | 3 refills | Status: DC

## 2021-05-04 MED ORDER — ATORVASTATIN CALCIUM 20 MG PO TABS
20.0000 mg | ORAL_TABLET | Freq: Every day | ORAL | 3 refills | Status: AC
Start: 2021-05-04 — End: ?

## 2021-05-04 NOTE — Telephone Encounter (Signed)
Patient daughter is calling and is requesting a call back regarding labs, please advise. CB is [phone]

## 2021-05-04 NOTE — Telephone Encounter (Signed)
Patient daughter  notified of update  and verbalized understanding. Rx sent to pharmacy. Breeona (pt daughter) will call back to scheduled lab appt. For 1 month.

## 2021-05-21 ENCOUNTER — Telehealth: Payer: Self-pay | Admitting: Adult Health

## 2021-05-21 NOTE — Telephone Encounter (Signed)
Pts daughter called and stated that the pt has done this kind of appointment w/someone that came out to the house.

## 2021-05-21 NOTE — Telephone Encounter (Signed)
This has been taking care of. Pt daughter stated she spoke with front office staff and advised them since pt had a physically recently with Tommi Rumps, she didn't need to do the AWV at this time.

## 2021-05-21 NOTE — Telephone Encounter (Signed)
Left message for patient to call back and schedule Medicare Annual Wellness Visit (AWV) either virtually or in office.   AWV-S PER PALMET[place] 02/23/13 please schedule at anytime with [facility] Nurse Health Advisor 1 or 2   This should be a 45 minute visit.

## 2021-05-24 DIAGNOSIS — F0390 Unspecified dementia without behavioral disturbance: Secondary | ICD-10-CM | POA: Diagnosis not present

## 2021-05-24 DIAGNOSIS — F333 Major depressive disorder, recurrent, severe with psychotic symptoms: Secondary | ICD-10-CM | POA: Diagnosis not present

## 2021-05-24 DIAGNOSIS — F4481 Dissociative identity disorder: Secondary | ICD-10-CM | POA: Diagnosis not present

## 2021-05-24 DIAGNOSIS — F411 Generalized anxiety disorder: Secondary | ICD-10-CM | POA: Diagnosis not present

## 2021-05-30 DIAGNOSIS — J449 Chronic obstructive pulmonary disease, unspecified: Secondary | ICD-10-CM | POA: Diagnosis not present

## 2021-06-01 ENCOUNTER — Other Ambulatory Visit: Payer: Medicare PPO

## 2021-06-07 ENCOUNTER — Ambulatory Visit: Payer: Medicare PPO | Admitting: Adult Health

## 2021-06-18 ENCOUNTER — Ambulatory Visit: Payer: Medicare PPO | Admitting: Neurology

## 2021-06-19 ENCOUNTER — Telehealth: Payer: Self-pay | Admitting: Adult Health

## 2021-06-19 NOTE — Telephone Encounter (Signed)
Left message for patient to call back and schedule Medicare Annual Wellness Visit (AWV) either virtually or in office.   AWV-S PER PALMET[place] 02/23/13   please schedule at anytime with [facility] Nurse Health Advisor 1 or 2   This should be a 45 minute visit.

## 2021-06-22 ENCOUNTER — Other Ambulatory Visit: Payer: Medicare PPO

## 2021-06-29 ENCOUNTER — Other Ambulatory Visit: Payer: Medicare PPO

## 2021-06-30 DIAGNOSIS — J449 Chronic obstructive pulmonary disease, unspecified: Secondary | ICD-10-CM | POA: Diagnosis not present

## 2021-07-09 DIAGNOSIS — M25512 Pain in left shoulder: Secondary | ICD-10-CM | POA: Diagnosis not present

## 2021-07-09 DIAGNOSIS — S42035A Nondisplaced fracture of lateral end of left clavicle, initial encounter for closed fracture: Secondary | ICD-10-CM | POA: Diagnosis not present

## 2021-07-24 DIAGNOSIS — M25512 Pain in left shoulder: Secondary | ICD-10-CM | POA: Diagnosis not present

## 2021-07-24 DIAGNOSIS — S42035A Nondisplaced fracture of lateral end of left clavicle, initial encounter for closed fracture: Secondary | ICD-10-CM | POA: Diagnosis not present

## 2021-07-25 ENCOUNTER — Other Ambulatory Visit: Payer: Self-pay | Admitting: Adult Health

## 2021-07-25 ENCOUNTER — Other Ambulatory Visit (INDEPENDENT_AMBULATORY_CARE_PROVIDER_SITE_OTHER): Payer: Medicare PPO

## 2021-07-25 ENCOUNTER — Other Ambulatory Visit: Payer: Self-pay

## 2021-07-25 DIAGNOSIS — E039 Hypothyroidism, unspecified: Secondary | ICD-10-CM

## 2021-07-25 LAB — TSH: TSH: 12.31 u[IU]/mL — ABNORMAL HIGH (ref 0.35–5.50)

## 2021-07-25 MED ORDER — LEVOTHYROXINE SODIUM 100 MCG PO TABS: 100.0000 ug | ORAL_TABLET | Freq: Every day | ORAL | 0 refills | Status: DC

## 2021-07-31 DIAGNOSIS — J449 Chronic obstructive pulmonary disease, unspecified: Secondary | ICD-10-CM | POA: Diagnosis not present

## 2021-08-14 ENCOUNTER — Encounter: Payer: Self-pay | Admitting: Neurology

## 2021-08-15 ENCOUNTER — Other Ambulatory Visit: Payer: Self-pay | Admitting: Adult Health

## 2021-08-15 ENCOUNTER — Telehealth: Payer: Self-pay

## 2021-08-15 ENCOUNTER — Encounter: Payer: Self-pay | Admitting: Adult Health

## 2021-08-15 DIAGNOSIS — R627 Adult failure to thrive: Secondary | ICD-10-CM

## 2021-08-15 NOTE — Telephone Encounter (Signed)
Noted, thank you

## 2021-08-15 NOTE — Telephone Encounter (Signed)
-----   Message from Kathrynn Ducking, MD sent at 08/14/2021  4:24 PM EDT ----- This patient apparently has had some decline in her functional level recently, has not been seen through our office since May 2021.  Please try to get her worked in sometime in the next several weeks.

## 2021-08-15 NOTE — Telephone Encounter (Signed)
Pt's daughter called back and accepted the 10-03 appointment and is aware pt is to check in at 3:30.  This is Pharmacist, hospital

## 2021-08-15 NOTE — Telephone Encounter (Signed)
I called pt and left vm asking for call back to schedule f/u appt.   If pt calls back please offer 08/27/21 at 400 pm. This is a new patient slot held for this patient.

## 2021-08-16 ENCOUNTER — Telehealth: Payer: Self-pay

## 2021-08-16 NOTE — Telephone Encounter (Signed)
Spoke with patient's daughter Yanessa and scheduled an in-person Palliative Consult for 09/06/21 @ 1:30PM with Dr. Hollace Kinnier. Documentation will be noted in [place].   COVID screening was negative. Dogs in the home but they are crated and 3 cats. Patient lives with daughter and SIL.   Consent obtained; updated Outlook/Netsmart/Team List and Epic.   Family is aware they may be receiving a call from provider the day before or day of to confirm appointment.

## 2021-08-21 DIAGNOSIS — S42002A Fracture of unspecified part of left clavicle, initial encounter for closed fracture: Secondary | ICD-10-CM | POA: Diagnosis not present

## 2021-08-24 ENCOUNTER — Other Ambulatory Visit: Payer: Self-pay

## 2021-08-24 ENCOUNTER — Telehealth: Payer: Self-pay

## 2021-08-24 DIAGNOSIS — E039 Hypothyroidism, unspecified: Secondary | ICD-10-CM

## 2021-08-24 MED ORDER — LEVOTHYROXINE SODIUM 100 MCG PO TABS: 100.0000 ug | ORAL_TABLET | Freq: Every day | ORAL | 0 refills | Status: DC

## 2021-08-24 NOTE — Telephone Encounter (Signed)
Daughter of patient called requesting Rx refill  levothyroxine (SYNTHROID) 100 MCG tablet pt only has 1 day supply left pt has lab appt scheduled 10/7

## 2021-08-24 NOTE — Telephone Encounter (Signed)
Rx refilled electronically °

## 2021-08-27 ENCOUNTER — Encounter: Payer: Self-pay | Admitting: Neurology

## 2021-08-27 ENCOUNTER — Other Ambulatory Visit: Payer: Self-pay

## 2021-08-27 ENCOUNTER — Ambulatory Visit: Payer: Medicare PPO | Admitting: Neurology

## 2021-08-27 VITALS — BP 118/72 | HR 76 | Ht 63.0 in | Wt 137.0 lb

## 2021-08-27 DIAGNOSIS — R413 Other amnesia: Secondary | ICD-10-CM | POA: Diagnosis not present

## 2021-08-27 DIAGNOSIS — G35 Multiple sclerosis: Secondary | ICD-10-CM

## 2021-08-27 DIAGNOSIS — R269 Unspecified abnormalities of gait and mobility: Secondary | ICD-10-CM | POA: Diagnosis not present

## 2021-08-27 MED ORDER — DONEPEZIL HCL 5 MG PO TABS: 5.0000 mg | ORAL_TABLET | Freq: Every day | ORAL | 3 refills | Status: AC

## 2021-08-27 NOTE — Progress Notes (Signed)
Reason for visit: Memory disorder, history of multiple sclerosis  [name] is an 80 y.o. female  History of present illness:  [name] is a 48 year old right-handed white female with a history of multiple sclerosis and a history of psychosis in the past.  The patient is followed through psychiatry.  She has developed a progressive memory disorder.  She has a strong family history of Alzheimer's disease, she has 2 siblings with Alzheimer's.  The patient is living at home with her daughter.  She does need some help with bathing and dressing at times.  She usually sleeps fairly well at night but occasionally she will have vivid dreams.  She is having increased frequency of hallucinations as time has gone on some worsening confusion.  Occasionally, she may have some agitation but this is not a frequent event.  The patient has good gait stability.  She did fall out of bed while sleeping and fractured her left clavicle 6 weeks ago.  She is not in a lot of pain with this currently.  She takes Aricept in the evening.  She returns to the office today for an evaluation.  The daughter indicates that they have obtained a palliative care consultation which will occur next week.  In the past, they have looked into an extended care facility and have not felt that they could afford this.  Past Medical History:  Diagnosis Date   Abnormality of gait 05/27/2013   Cancer (HCC)    COPD (chronic obstructive pulmonary disease) (HCC)    Depression    Diplopia    Monocular, OS   Epilepsy ([place])    Hearing deficit    High cholesterol    Lung cancer ([place])    Memory deficit 05/27/2013   Multiple sclerosis ([place])    Murmur    Scoliosis    Syncope    Urine incontinence     Past Surgical History:  Procedure Laterality Date   ABDOMINAL HYSTERECTOMY  2007   [place]   CHOLECYSTECTOMY  2006   LUNG BIOPSY  2012   OVARIAN CYST REMOVAL  2008   Skin biopsy Left 12/08/2018    Superior Medial Anterior Ankle - Basal Cell Carcinoma, nodular pattern, ulcerated    Family History  Problem Relation Age of Onset   Stroke Mother    Heart disease Mother    Breast cancer Mother    Pancreatitis Mother    Alcohol abuse Mother    Arthritis Mother    Cancer Mother    Hyperlipidemia Mother    Mental illness Mother    Leukemia Father    Heart disease Father    Alcohol abuse Father    Arthritis Father    Cancer Father    Alcohol abuse Sister    Arthritis Sister    COPD Sister    Depression Sister    Alcohol abuse Brother    Diabetes Brother    Kidney disease Brother    Heart disease Brother    Heart attack Brother    Hypertension Brother    Mental illness Brother    Alzheimer's disease Brother    Heart attack Brother    Heart disease Brother    Hypertension Brother    Alcohol abuse Daughter    Cancer Daughter    COPD Daughter    Drug abuse Daughter    Heart disease Maternal Uncle    Diverticulitis Paternal Aunt     Social  history:  reports that she has quit smoking. Her smoking use included cigarettes. She smoked an average of .25 packs per day. She has never used smokeless tobacco. She reports that she does not drink alcohol and does not use drugs.    Allergies  Allergen Reactions   Dilantin [Phenytoin] Hives   Lactose Intolerance (Gi) Diarrhea   Shellfish Allergy Other (See Comments)    Childhood allergy--unknown reaction   Sulfa Antibiotics Hives    Medications:  Prior to Admission medications   Medication Sig Start Date End Date Taking? Authorizing Provider  ARIPiprazole (ABILIFY) 2 MG tablet  05/26/20  Yes [provider]  aspirin EC 81 MG tablet Take 81 mg by mouth daily.   Yes [provider]  atorvastatin (LIPITOR) 20 MG tablet Take 1 tablet (20 mg total) by mouth daily. Need for schedule yearly physical for further refills 05/04/21  Yes Nafziger, Tommi Rumps, NP  clonazePAM (KLONOPIN) 0.5 MG tablet Take 1 tablet (0.5 mg total)  by mouth daily. Patient taking differently: Take 0.5 mg by mouth 2 (two) times daily as needed for anxiety. 12/17/14  Yes Tanna Furry, MD  donepezil (ARICEPT) 5 MG tablet Take 1 tablet (5 mg total) by mouth at bedtime. 04/12/20  Yes Suzzanne Cloud, NP  levothyroxine (SYNTHROID) 100 MCG tablet Take 1 tablet (100 mcg total) by mouth daily. 08/24/21  Yes Nafziger, Tommi Rumps, NP  risperiDONE (RISPERDAL) 0.5 MG tablet  05/26/20  Yes [provider]  traZODone (DESYREL) 50 MG tablet Take 100 mg by mouth at bedtime.   Yes [provider]  venlafaxine XR (EFFEXOR-XR) 75 MG 24 hr capsule Take 1 capsule (75 mg total) by mouth daily. Patient taking differently: daily. 3 capsules daily. 03/22/20  Yes Rankin, Shuvon B, NP    ROS:  Out of a complete 14 system review of symptoms, the patient complains only of the following symptoms, and all other reviewed systems are negative.  Memory problems Hallucinations Vivid dreams  Blood pressure 118/72, pulse 76, height 5\' 3"  (1.6 m), weight 137 lb (62.1 kg).  Physical Exam  General: The patient is alert and cooperative at the time of the examination.  Skin: No significant peripheral edema is noted.   Neurologic Exam  Mental status: The patient is alert and oriented x 3 at the time of the examination. The Mini-Mental status examination done today shows a total score of 18/30.  The patient is able to name 7 animals in 60 seconds.   Cranial nerves: Facial symmetry is present. Speech is normal, no aphasia or dysarthria is noted. Extraocular movements are full. Visual fields are full.  Motor: The patient has good strength in all 4 extremities.  Sensory examination: Soft touch sensation is symmetric on the face, arms, and legs.  Coordination: The patient has good finger-nose-finger and heel-to-shin bilaterally.  The patient has some apraxia with finger-nose-finger bilaterally.  Gait and station: The patient has a normal gait. Tandem gait is  slightly unsteady. Romberg is negative, but is slightly unsteady. No drift is seen.  Reflexes: Deep tendon reflexes are symmetric.   Assessment/Plan:  1.  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  History of multiple sclerosis  3.  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  She is having some issues with vivid dreams and night, we will switch the Aricept to the morning.  I do not see a lot of utility in adding Namenda to the regimen at this time, but  this can be considered in the future.  The patient will be obtaining a palliative care consult in the near future which I think is reasonable.  I have suggested to the daughter that he may want to hire some assistance in the home intermittently.  Currently, the patient does not have a lot of agitation and she does not demonstrate dangerous behavior.  They will follow-up here in 6 months.  In the future, they can be followed through Dr. Brett Fairy.   Jill Alexanders MD 08/27/2021 4:08 PM  [facility] [address] [place], [place] [address]  Phone [phone] Fax [phone]

## 2021-08-30 DIAGNOSIS — J449 Chronic obstructive pulmonary disease, unspecified: Secondary | ICD-10-CM | POA: Diagnosis not present

## 2021-08-31 ENCOUNTER — Ambulatory Visit (HOSPITAL_COMMUNITY)
Admission: EM | Admit: 2021-08-31 | Discharge: 2021-08-31 | Disposition: A | Discharge status: Home / Self Care | Payer: Medicare PPO | Attending: Psychiatry | Admitting: Psychiatry

## 2021-08-31 ENCOUNTER — Other Ambulatory Visit: Payer: Self-pay

## 2021-08-31 ENCOUNTER — Other Ambulatory Visit: Payer: Medicare PPO

## 2021-08-31 DIAGNOSIS — F0283 Dementia in other diseases classified elsewhere, unspecified severity, with mood disturbance: Secondary | ICD-10-CM | POA: Insufficient documentation

## 2021-08-31 DIAGNOSIS — Z91013 Allergy to seafood: Secondary | ICD-10-CM | POA: Diagnosis not present

## 2021-08-31 DIAGNOSIS — F331 Major depressive disorder, recurrent, moderate: Secondary | ICD-10-CM | POA: Insufficient documentation

## 2021-08-31 DIAGNOSIS — J449 Chronic obstructive pulmonary disease, unspecified: Secondary | ICD-10-CM | POA: Diagnosis not present

## 2021-08-31 DIAGNOSIS — R9431 Abnormal electrocardiogram [ECG] [EKG]: Secondary | ICD-10-CM | POA: Diagnosis not present

## 2021-08-31 DIAGNOSIS — F0390 Unspecified dementia without behavioral disturbance: Secondary | ICD-10-CM | POA: Diagnosis not present

## 2021-08-31 DIAGNOSIS — R4182 Altered mental status, unspecified: Secondary | ICD-10-CM | POA: Diagnosis not present

## 2021-08-31 DIAGNOSIS — F0282 Dementia in other diseases classified elsewhere, unspecified severity, with psychotic disturbance: Secondary | ICD-10-CM | POA: Diagnosis not present

## 2021-08-31 DIAGNOSIS — R440 Auditory hallucinations: Secondary | ICD-10-CM | POA: Diagnosis not present

## 2021-08-31 DIAGNOSIS — N390 Urinary tract infection, site not specified: Secondary | ICD-10-CM | POA: Diagnosis not present

## 2021-08-31 DIAGNOSIS — R441 Visual hallucinations: Secondary | ICD-10-CM | POA: Diagnosis not present

## 2021-08-31 DIAGNOSIS — R918 Other nonspecific abnormal finding of lung field: Secondary | ICD-10-CM | POA: Diagnosis not present

## 2021-08-31 DIAGNOSIS — F02818 Dementia in other diseases classified elsewhere, unspecified severity, with other behavioral disturbance: Secondary | ICD-10-CM | POA: Diagnosis not present

## 2021-08-31 DIAGNOSIS — Z87891 Personal history of nicotine dependence: Secondary | ICD-10-CM | POA: Diagnosis not present

## 2021-08-31 DIAGNOSIS — Z882 Allergy status to sulfonamides status: Secondary | ICD-10-CM | POA: Diagnosis not present

## 2021-08-31 DIAGNOSIS — F03918 Unspecified dementia, unspecified severity, with other behavioral disturbance: Secondary | ICD-10-CM

## 2021-08-31 DIAGNOSIS — R443 Hallucinations, unspecified: Secondary | ICD-10-CM

## 2021-08-31 NOTE — ED Provider Notes (Signed)
Behavioral Health Urgent Care Medical Screening Exam  Patient Name: [name] MRN: [id number] Date of Evaluation: 08/31/21 Chief Complaint:   Diagnosis:  Final diagnoses:  Major depressive disorder, recurrent episode, moderate ([place])  Dementia with behavioral disturbance  Hallucination    History of Present illness: [name] is a 80 y.o. female.  Presents to [facility] urgent care accompanied by her daughter.  Daughter reports she is her primary caregiver.  States yesterday patient was disassociating and hallucinating.  Reports the voices were command in nature. "  The situation was really bad yesterday."  states she is followed by [facility], planning for transfer.  States she is prescribed Abilify, Klonopin Risperdal and Seroquel for mood stabilization.  Daughter reports possible history with dementia that Alzheimer's.  Reports she was advised to follow-up here for placement.  [place] is currently denying suicidal or homicidal ideations.  Denies auditory or visual hallucinations.  States " I feel really embarrassed, but I do not want to hurt myself or my daughter."  She denied any drugs or illicit substance abuse history.  Patient does not appear to meet inpatient criteria at this time.  Discussed additional outpatient resources follow-up.  Daughter was receptive to plan.   During evaluation [name] is sitting in no acute distress.  She is alert/oriented x 4; calm/cooperative; and mood congruent with affect. She is speaking in a clear tone at moderate volume, and normal pace; with good eye contact. Her thought process is coherent and relevant; There is no indication that she is currently responding to internal/external stimuli or experiencing delusional thought content; and she has denied suicidal/self-harm/homicidal ideation, psychosis, and paranoia.   Patient has remained calm throughout assessment and has answered questions appropriately.     At this time [name] is educated and verbalizes understanding of mental health resources and other crisis services in the community. She is instructed to call 911 and present to the nearest emergency room should she experience any suicidal/homicidal ideation, auditory/visual/hallucinations, or detrimental worsening of her mental health condition.  She was a also advised by Probation officer that she could call the toll-free phone on insurance card to assist with identifying in network counselors and agencies.     Psychiatric Specialty Exam  Presentation  General Appearance:Appropriate for Environment Eye Contact:Good Speech:Clear and Coherent Speech Volume:Normal Handedness:Right  Mood and Affect  Mood: No data recorded Affect: Congruent  Thought Process  Thought Processes: Coherent Descriptions of Associations:No data recorded Orientation:Full (Time, Place and Person) Thought Content:Logical   Hallucinations:None Ideas of Reference:None Suicidal Thoughts:No Homicidal Thoughts:No  Sensorium  Memory: Immediate Fair; Recent Fair Judgment: Fair Insight: Fair  Community education officer  Concentration: Fair Attention Span: Good Recall: Good Fund of Knowledge: Good Language: Good  Psychomotor Activity  Psychomotor Activity: Normal  Assets  Assets: Social Support; Housing  Sleep  Sleep: No data recorded Number of hours:  No data recorded  Nutritional Assessment (For OBS and FBC admissions only) Has the patient had a weight loss or gain of 10 pounds or more in the last 3 months?: No Has the patient had a decrease in food intake/or appetite?: No Does the patient have dental problems?: No Does the patient have eating habits or behaviors that may be indicators of an eating disorder including binging or inducing vomiting?: No Has the patient recently lost weight without trying?: 0 Has the patient been eating poorly because of a decreased appetite?: 0 Malnutrition Screening Tool Score:  0   Physical Exam: Physical  Exam Vitals and nursing note reviewed.  Cardiovascular:     Rate and Rhythm: Normal rate and regular rhythm.  Pulmonary:     Effort: Pulmonary effort is normal.  Neurological:     General: No focal deficit present.     Mental Status: She is alert and oriented to person, place, and time.  Psychiatric:        Attention and Perception: Attention normal.        Mood and Affect: Mood normal.        Speech: Speech normal.        Behavior: Behavior normal.        Thought Content: Thought content normal.        Cognition and Memory: Cognition normal.        Judgment: Judgment normal.   Review of Systems  Eyes: Negative.   Cardiovascular: Negative.   Genitourinary: Negative.   Endo/Heme/Allergies: Negative.   Psychiatric/Behavioral:  Negative for depression, hallucinations and suicidal ideas. The patient is not nervous/anxious.   All other systems reviewed and are negative. Blood pressure (!) 157/66, pulse 75, temperature 98.2 F (36.8 C), temperature source Oral, resp. rate 18, SpO2 95 %. There is no height or weight on file to calculate BMI.  Musculoskeletal: Strength & Muscle Tone: within normal limits Gait & Station: normal Patient leans: N/A   [place] MSE Discharge Disposition for Follow up and Recommendations: Based on my evaluation the patient does not appear to have an emergency medical condition and can be discharged with resources and follow up care in outpatient services for Medication Management -Keep follow-up with all outpatient providers  Derrill [place], NP 08/31/2021, 11:20 AM

## 2021-08-31 NOTE — Discharge Instructions (Signed)
Take all medications as prescribed. Keep all follow-up appointments as scheduled.  Do not consume alcohol or use illegal drugs while on prescription medications. Report any adverse effects from your medications to your primary care provider promptly.  In the event of recurrent symptoms or worsening symptoms, call 911, a crisis hotline, or go to the nearest emergency department for evaluation.   

## 2021-08-31 NOTE — BH Assessment (Signed)
Pt to [facility] with her daughter/caregiver voluntarily reporting AVH yesterday. Daughter reports that around 5pm yesterday pt started having hallucinations and was in "extreme distress" due to seeing a dead body on her floor, thinking the house was being hijacked and that her daughter was being held at gun point with robbers demanding her wallet. Daughter reports that this happened about two weeks ago but yesterday was different because this time she was following commands and this episode lasted for an hour. Daughter reports that pt remembers what happened and is now embarrassed about it. Daughter reports pt has dx of dementia and dissociative disorder and since being dx with dementia her mental health has declined. Daughter reports pt is seeing [name] at [facility] in [place] for medication management and is prescribed Risperdal, Abilify, Seroquel, clonopin and anti depressants. Pt has hx of inpatient treatment at [facility] in 1993. Pt denies SI, HI, AVH and substance use.   Pt is routine.

## 2021-09-02 ENCOUNTER — Encounter: Payer: Self-pay | Admitting: Adult Health

## 2021-09-06 DIAGNOSIS — G301 Alzheimer's disease with late onset: Secondary | ICD-10-CM | POA: Diagnosis not present

## 2021-09-06 DIAGNOSIS — G35 Multiple sclerosis: Secondary | ICD-10-CM | POA: Diagnosis not present

## 2021-09-06 DIAGNOSIS — Z515 Encounter for palliative care: Secondary | ICD-10-CM | POA: Diagnosis not present

## 2021-09-06 DIAGNOSIS — R5382 Chronic fatigue, unspecified: Secondary | ICD-10-CM | POA: Diagnosis not present

## 2021-09-06 DIAGNOSIS — F4481 Dissociative identity disorder: Secondary | ICD-10-CM | POA: Diagnosis not present

## 2021-09-07 ENCOUNTER — Other Ambulatory Visit: Payer: Medicare PPO

## 2021-09-10 ENCOUNTER — Telehealth: Payer: Self-pay

## 2021-09-10 NOTE — Telephone Encounter (Signed)
Message left for daughter re: equipment needed. Email also sent

## 2021-09-14 ENCOUNTER — Ambulatory Visit: Payer: Medicare PPO | Admitting: Adult Health

## 2021-09-17 DIAGNOSIS — F0390 Unspecified dementia without behavioral disturbance: Secondary | ICD-10-CM | POA: Diagnosis not present

## 2021-09-17 DIAGNOSIS — F333 Major depressive disorder, recurrent, severe with psychotic symptoms: Secondary | ICD-10-CM | POA: Diagnosis not present

## 2021-09-17 DIAGNOSIS — F4481 Dissociative identity disorder: Secondary | ICD-10-CM | POA: Diagnosis not present

## 2021-09-18 ENCOUNTER — Other Ambulatory Visit: Payer: Self-pay

## 2021-09-19 ENCOUNTER — Other Ambulatory Visit: Payer: Self-pay | Admitting: Adult Health

## 2021-09-19 ENCOUNTER — Ambulatory Visit: Payer: Medicare PPO | Admitting: Adult Health

## 2021-09-19 DIAGNOSIS — E039 Hypothyroidism, unspecified: Secondary | ICD-10-CM

## 2021-09-25 ENCOUNTER — Other Ambulatory Visit: Payer: Self-pay

## 2021-09-26 ENCOUNTER — Encounter: Payer: Self-pay | Admitting: Adult Health

## 2021-09-26 ENCOUNTER — Ambulatory Visit: Payer: Medicare PPO | Admitting: Adult Health

## 2021-09-26 VITALS — BP 138/72 | HR 67 | Temp 98.7°F | Ht 63.0 in | Wt 137.0 lb

## 2021-09-26 DIAGNOSIS — F03B18 Unspecified dementia, moderate, with other behavioral disturbance: Secondary | ICD-10-CM

## 2021-09-26 DIAGNOSIS — R300 Dysuria: Secondary | ICD-10-CM

## 2021-09-26 DIAGNOSIS — F02811 Dementia in other diseases classified elsewhere, unspecified severity, with agitation: Secondary | ICD-10-CM | POA: Diagnosis not present

## 2021-09-26 DIAGNOSIS — E039 Hypothyroidism, unspecified: Secondary | ICD-10-CM | POA: Diagnosis not present

## 2021-09-26 LAB — POCT URINALYSIS DIPSTICK
Bilirubin, UA: NEGATIVE
Blood, UA: NEGATIVE
Glucose, UA: NEGATIVE
Ketones, UA: NEGATIVE
Nitrite, UA: NEGATIVE
Protein, UA: NEGATIVE
Spec Grav, UA: 1.02 (ref 1.010–1.025)
Urobilinogen, UA: 0.2 E.U./dL
pH, UA: 6 (ref 5.0–8.0)

## 2021-09-26 LAB — TSH: TSH: 0.05 u[IU]/mL — ABNORMAL LOW (ref 0.35–5.50)

## 2021-09-26 NOTE — Progress Notes (Signed)
Subjective:    Patient ID: [name], female    DOB: July 30, 1941, 80 y.o.   MRN: [id number]  HPI 80 year old female who  has a past medical history of Abnormality of gait (05/27/2013), Cancer ([place]), COPD (chronic obstructive pulmonary disease) ([place]), Depression, Diplopia, Epilepsy ([place]), Hearing deficit, High cholesterol, Lung cancer ([place]), Memory deficit (05/27/2013), Multiple sclerosis ([place]), Murmur, Scoliosis, Syncope, and Urine incontinence.  She presents to the office today with her daughter   She was seen at [facility] emergency room on 08/31/2021 for evaluation of auditory and visual hallucinations.  She does have a history of dementia however her hallucinations have not been this vivid or extreme.  Her daughter reported that the patient was saying that there were people in her house trying to steal her money and the patient was trying to give them her money.  In the emergency room she was found to have a urinary tract infection and was placed on Keflex 500 mg 4 times daily, which she finished 11 days ago.  Chest x-ray, EKG, head CT, and other labs were unremarkable.  Her daughter reports that she does not feel as though her mother has returned back to baseline, she continues to have vivid auditory and visual hallucinations.  Last night she walked out of the house around 9 AM wanting to go by a boat and later reported that she wanted to go to see Halloween decorations.  Additionally, we need to recheck her TSH, her last TSH was 12 3 1.  At this time Synthroid was increased 100 mcg from 17  Lab Results  Component Value Date   TSH 0.05 (L) 09/26/2021      Review of Systems See HPI   Past Medical History:  Diagnosis Date   Abnormality of gait 05/27/2013   Cancer (HCC)    COPD (chronic obstructive pulmonary disease) (HCC)    Depression    Diplopia    Monocular, OS   Epilepsy (HCC)    Hearing deficit    High cholesterol    Lung cancer (HCC)    Memory deficit 05/27/2013    Multiple sclerosis ([place])    Murmur    Scoliosis    Syncope    Urine incontinence     Social History   Socioeconomic History   Marital status: Divorced    Spouse name: Not on file   Number of children: 1   Years of education: 38   Highest education level: Master's degree (e.g., MA, MS, MEng, MEd, MSW, MBA)  Occupational History    Employer: RETIRED  Tobacco Use   Smoking status: Former    Packs/day: 0.25    Types: Cigarettes   Smokeless tobacco: Never  Vaping Use   Vaping Use: Never used  Substance and Sexual Activity   Alcohol use: No   Drug use: No   Sexual activity: Not on file  Other Topics Concern   Not on file  Social History Narrative   08/27/21 lives with dgtr,  Alver Fisher    Social Determinants of Health   Financial Resource Strain: Not on file  Food Insecurity: Not on file  Transportation Needs: Not on file  Physical Activity: Not on file  Stress: Not on file  Social Connections: Not on file  Intimate Partner Violence: Not on file    Past Surgical History:  Procedure Laterality Date   ABDOMINAL HYSTERECTOMY  2007   [place]  2006  LUNG BIOPSY  2012   OVARIAN CYST REMOVAL  2008   Skin biopsy Left 12/08/2018   Superior Medial Anterior Ankle - Basal Cell Carcinoma, nodular pattern, ulcerated    Family History  Problem Relation Age of Onset   Stroke Mother    Heart disease Mother    Breast cancer Mother    Pancreatitis Mother    Alcohol abuse Mother    Arthritis Mother    Cancer Mother    Hyperlipidemia Mother    Mental illness Mother    Leukemia Father    Heart disease Father    Alcohol abuse Father    Arthritis Father    Cancer Father    Alcohol abuse Sister    Arthritis Sister    COPD Sister    Depression Sister    Alcohol abuse Brother    Diabetes Brother    Kidney disease Brother    Heart disease Brother    Heart attack Brother    Hypertension Brother    Mental illness Brother     Alzheimer's disease Brother    Heart attack Brother    Heart disease Brother    Hypertension Brother    Alcohol abuse Daughter    Cancer Daughter    COPD Daughter    Drug abuse Daughter    Heart disease Maternal Uncle    Diverticulitis Paternal Aunt     Allergies  Allergen Reactions   Dilantin [Phenytoin] Hives   Lactose Intolerance (Gi) Diarrhea   Shellfish Allergy Other (See Comments)    Childhood allergy--unknown reaction   Sulfa Antibiotics Hives    Current Outpatient Medications on File Prior to Visit  Medication Sig Dispense Refill   ARIPiprazole (ABILIFY) 2 MG tablet      aspirin EC 81 MG tablet Take 81 mg by mouth daily.     atorvastatin (LIPITOR) 20 MG tablet Take 1 tablet (20 mg total) by mouth daily. Need for schedule yearly physical for further refills 90 tablet 3   clonazePAM (KLONOPIN) 0.5 MG tablet Take 1 tablet (0.5 mg total) by mouth daily. (Patient taking differently: Take 0.5 mg by mouth 2 (two) times daily as needed for anxiety.) 14 tablet 0   donepezil (ARICEPT) 5 MG tablet Take 1 tablet (5 mg total) by mouth daily after breakfast. 90 tablet 3   levothyroxine (SYNTHROID) 100 MCG tablet Take 1 tablet by mouth once daily 30 tablet 0   QUEtiapine (SEROQUEL) 50 MG tablet Take 50 mg by mouth at bedtime.     risperiDONE (RISPERDAL) 0.5 MG tablet      traZODone (DESYREL) 50 MG tablet Take 100 mg by mouth at bedtime.     venlafaxine XR (EFFEXOR-XR) 75 MG 24 hr capsule Take 1 capsule (75 mg total) by mouth daily. (Patient taking differently: daily. 3 capsules daily.) 30 capsule 0   No current facility-administered medications on file prior to visit.    BP 138/72   Pulse 67   Temp 98.7 F (37.1 C) (Oral)   Ht 5\' 3"  (1.6 m)   Wt 137 lb (62.1 kg)   SpO2 92%   BMI 24.27 kg/m       Objective:   Physical Exam Vitals and nursing note reviewed.  Constitutional:      Appearance: Normal appearance.  Cardiovascular:     Rate and Rhythm: Normal rate and  regular rhythm.     Pulses: Normal pulses.     Heart sounds: Normal heart sounds.  Pulmonary:     Effort:  Pulmonary effort is normal.     Breath sounds: Normal breath sounds.  Musculoskeletal:        General: Normal range of motion.  Skin:    General: Skin is warm and dry.  Neurological:     Mental Status: She is alert. Mental status is at baseline.  Psychiatric:        Mood and Affect: Mood normal.        Behavior: Behavior normal.        Thought Content: Thought content normal.        Judgment: Judgment normal.      Assessment & Plan:  1. Moderate dementia with other behavioral disturbance, unspecified dementia type -Recheck urinalysis and send culture as well as check ammonia level.  Likely worsening disease process. - POC Urinalysis Dipstick - Culture, Urine - Ammonia; Future - Ammonia - Ammonia; Future  2. Acquired hypothyroidism - Consider dose adjustment  - TSH; Future - TSH  Dorothyann Peng, NP

## 2021-09-27 ENCOUNTER — Encounter: Payer: Self-pay | Admitting: Adult Health

## 2021-09-27 ENCOUNTER — Other Ambulatory Visit (INDEPENDENT_AMBULATORY_CARE_PROVIDER_SITE_OTHER): Payer: Medicare PPO

## 2021-09-27 ENCOUNTER — Telehealth: Payer: Self-pay | Admitting: Adult Health

## 2021-09-27 DIAGNOSIS — F03B18 Unspecified dementia, moderate, with other behavioral disturbance: Secondary | ICD-10-CM | POA: Diagnosis not present

## 2021-09-27 LAB — URINE CULTURE
MICRO NUMBER:: 12583946
Result:: NO GROWTH
SPECIMEN QUALITY:: ADEQUATE

## 2021-09-27 LAB — AMMONIA: Ammonia: 37 umol/L — ABNORMAL HIGH (ref 11–35)

## 2021-09-27 MED ORDER — LEVOTHYROXINE SODIUM 88 MCG PO TABS: 88.0000 ug | ORAL_TABLET | Freq: Every day | ORAL | 3 refills | Status: AC

## 2021-09-27 NOTE — Telephone Encounter (Signed)
Updated daughter on labs. Need to tweek dose of synthroid from 100 mcg to 88 mcg   Lab Results  Component Value Date   TSH 0.05 (L) 09/26/2021

## 2021-09-30 DIAGNOSIS — J449 Chronic obstructive pulmonary disease, unspecified: Secondary | ICD-10-CM | POA: Diagnosis not present

## 2021-10-15 ENCOUNTER — Other Ambulatory Visit: Payer: Self-pay | Admitting: Adult Health

## 2021-10-15 DIAGNOSIS — E039 Hypothyroidism, unspecified: Secondary | ICD-10-CM

## 2021-10-25 ENCOUNTER — Other Ambulatory Visit: Payer: Self-pay | Admitting: Adult Health

## 2021-10-25 ENCOUNTER — Encounter: Payer: Self-pay | Admitting: Adult Health

## 2021-10-25 MED ORDER — AMOXICILLIN 500 MG PO TABS: 2000.0000 mg | ORAL_TABLET | Freq: Once | ORAL | 0 refills | Status: AC

## 2021-10-26 DIAGNOSIS — F0390 Unspecified dementia without behavioral disturbance: Secondary | ICD-10-CM | POA: Diagnosis not present

## 2021-10-26 DIAGNOSIS — F4481 Dissociative identity disorder: Secondary | ICD-10-CM | POA: Diagnosis not present

## 2021-10-26 DIAGNOSIS — F333 Major depressive disorder, recurrent, severe with psychotic symptoms: Secondary | ICD-10-CM | POA: Diagnosis not present

## 2021-11-11 ENCOUNTER — Encounter: Payer: Self-pay | Admitting: Adult Health

## 2021-11-12 ENCOUNTER — Other Ambulatory Visit: Payer: Self-pay

## 2021-11-12 ENCOUNTER — Telehealth (INDEPENDENT_AMBULATORY_CARE_PROVIDER_SITE_OTHER): Payer: Medicare PPO | Admitting: Family Medicine

## 2021-11-12 DIAGNOSIS — R451 Restlessness and agitation: Secondary | ICD-10-CM | POA: Diagnosis not present

## 2021-11-12 DIAGNOSIS — F03918 Unspecified dementia, unspecified severity, with other behavioral disturbance: Secondary | ICD-10-CM | POA: Diagnosis not present

## 2021-11-12 MED ORDER — CEPHALEXIN 500 MG PO CAPS: 500.0000 mg | ORAL_CAPSULE | Freq: Three times a day (TID) | ORAL | 0 refills | Status: AC

## 2021-11-12 NOTE — Progress Notes (Signed)
Patient ID: [name], female   DOB: 02-Nov-1941, 80 y.o.   MRN: [id number]  This visit type was conducted due to national recommendations for restrictions regarding the COVID-19 pandemic in an effort to limit this patient's exposure and mitigate transmission in our community.   Virtual Visit via Video Note  I connected with Waldemar Dickens on 11/12/21 at 11:45 AM EST by a video enabled telemedicine application and verified that I am speaking with the correct person using two identifiers.  Location patient: home Location provider:work or home office Persons participating in the virtual visit: patient, provider, and patient's daughter who facilitated this call because of patient's severe dementia.  I discussed the limitations of evaluation and management by telemedicine and the availability of in person appointments. The patient expressed understanding and agreed to proceed.   HPI:  Patient does have advanced dementia and currently under palliative care.  Daughter is concerned she may have UTI.  She has gotten like this similarly in the past with UTI.  She has had some increased urinary incontinence.  She uses depends.  She had some restlessness recently and decreased sleep.  She also has history of dental abscess which was diagnosed December 6.  Tomorrow she will get a couple of teeth pulled.  She recently finished up course of amoxicillin.  Daughter has noticed that her urine looks darker than usual.  No reported fever.  Difficult to sort out how much of her confusion could be due to increased pain from her dental abscess versus possible infection.  Daughter feels like it would be very difficult to get urine sample.   ROS: See pertinent positives and negatives per HPI.  Past Medical History:  Diagnosis Date   Abnormality of gait 05/27/2013   Cancer (HCC)    COPD (chronic obstructive pulmonary disease) (HCC)    Depression    Diplopia    Monocular, OS   Epilepsy ([place])    Hearing deficit     High cholesterol    Lung cancer ([place])    Memory deficit 05/27/2013   Multiple sclerosis ([place])    Murmur    Scoliosis    Syncope    Urine incontinence     Past Surgical History:  Procedure Laterality Date   ABDOMINAL HYSTERECTOMY  2007   [place]   CHOLECYSTECTOMY  2006   LUNG BIOPSY  2012   OVARIAN CYST REMOVAL  2008   Skin biopsy Left 12/08/2018   Superior Medial Anterior Ankle - Basal Cell Carcinoma, nodular pattern, ulcerated    Family History  Problem Relation Age of Onset   Stroke Mother    Heart disease Mother    Breast cancer Mother    Pancreatitis Mother    Alcohol abuse Mother    Arthritis Mother    Cancer Mother    Hyperlipidemia Mother    Mental illness Mother    Leukemia Father    Heart disease Father    Alcohol abuse Father    Arthritis Father    Cancer Father    Alcohol abuse Sister    Arthritis Sister    COPD Sister    Depression Sister    Alcohol abuse Brother    Diabetes Brother    Kidney disease Brother    Heart disease Brother    Heart attack Brother    Hypertension Brother    Mental illness Brother    Alzheimer's disease Brother    Heart attack Brother  Heart disease Brother    Hypertension Brother    Alcohol abuse Daughter    Cancer Daughter    COPD Daughter    Drug abuse Daughter    Heart disease Maternal Uncle    Diverticulitis Paternal Aunt     SOCIAL HX: Former smoker.  Patient under palliative care.  Excellent supportive care from daughter   Current Outpatient Medications:    ARIPiprazole (ABILIFY) 2 MG tablet, , Disp: , Rfl:    aspirin EC 81 MG tablet, Take 81 mg by mouth daily., Disp: , Rfl:    atorvastatin (LIPITOR) 20 MG tablet, Take 1 tablet (20 mg total) by mouth daily. Need for schedule yearly physical for further refills, Disp: 90 tablet, Rfl: 3   clonazePAM (KLONOPIN) 0.5 MG tablet, Take 1 tablet (0.5 mg total) by mouth daily. (Patient taking differently: Take 0.5 mg by mouth 2  (two) times daily as needed for anxiety.), Disp: 14 tablet, Rfl: 0   donepezil (ARICEPT) 5 MG tablet, Take 1 tablet (5 mg total) by mouth daily after breakfast., Disp: 90 tablet, Rfl: 3   haloperidol (HALDOL) 2 MG tablet, Take 2 mg by mouth daily as needed., Disp: , Rfl:    levothyroxine (SYNTHROID) 88 MCG tablet, Take 1 tablet (88 mcg total) by mouth daily., Disp: 90 tablet, Rfl: 3   QUEtiapine (SEROQUEL) 50 MG tablet, Take 100 mg by mouth at bedtime., Disp: , Rfl:    traZODone (DESYREL) 50 MG tablet, Take 100 mg by mouth at bedtime., Disp: , Rfl:    venlafaxine XR (EFFEXOR-XR) 75 MG 24 hr capsule, Take 1 capsule (75 mg total) by mouth daily. (Patient taking differently: daily. 3 capsules daily.), Disp: 30 capsule, Rfl: 0   cephALEXin (KEFLEX) 500 MG capsule, Take 1 capsule (500 mg total) by mouth 3 (three) times daily., Disp: 21 capsule, Rfl: 0  EXAM:  VITALS per patient if applicable:  GENERAL: alert, oriented, appears well and in no acute distress  HEENT: atraumatic, conjunttiva clear, no obvious abnormalities on inspection of external nose and ears  NECK: normal movements of the head and neck  LUNGS: on inspection no signs of respiratory distress, breathing rate appears normal, no obvious gross SOB, gasping or wheezing  CV: no obvious cyanosis  MS: moves all visible extremities without noticeable abnormality  PSYCH/NEURO: pleasant and cooperative, no obvious depression or anxiety, speech and thought processing grossly intact  ASSESSMENT AND PLAN:  Discussed the following assessment and plan:  Patient has advanced dementia with recent increasing agitation.  Difficult to sort out how much of this may be related to her dental abscess versus infection versus other.  Daughter has noticed some recent change in the appearance of her urine and increased odor and is concerned about possible UTI.  -Given difficult circumstances with challenges of getting clean-catch urine we will go ahead  and cover empirically with Keflex 500 mg 3 times daily for 7 days     I discussed the assessment and treatment plan with the patient. The patient was provided an opportunity to ask questions and all were answered. The patient agreed with the plan and demonstrated an understanding of the instructions.   The patient was advised to call back or seek an in-person evaluation if the symptoms worsen or if the condition fails to improve as anticipated.     Carolann Littler, MD

## 2021-11-13 NOTE — Telephone Encounter (Signed)
Pt had virtual visit 12/19 with Dr Elease Hashimoto, was given keflex. Was able to get teeth extracted today. States she will call back if needs anything else.

## 2021-11-14 ENCOUNTER — Other Ambulatory Visit: Payer: Self-pay | Admitting: *Deleted

## 2021-11-14 DIAGNOSIS — G35 Multiple sclerosis: Secondary | ICD-10-CM | POA: Diagnosis not present

## 2021-11-14 DIAGNOSIS — N390 Urinary tract infection, site not specified: Secondary | ICD-10-CM | POA: Diagnosis not present

## 2021-11-14 DIAGNOSIS — F4481 Dissociative identity disorder: Secondary | ICD-10-CM | POA: Diagnosis not present

## 2021-11-14 DIAGNOSIS — K047 Periapical abscess without sinus: Secondary | ICD-10-CM | POA: Diagnosis not present

## 2021-11-14 DIAGNOSIS — Z515 Encounter for palliative care: Secondary | ICD-10-CM | POA: Diagnosis not present

## 2021-11-14 DIAGNOSIS — R5382 Chronic fatigue, unspecified: Secondary | ICD-10-CM | POA: Diagnosis not present

## 2021-11-14 DIAGNOSIS — I739 Peripheral vascular disease, unspecified: Secondary | ICD-10-CM

## 2021-11-14 DIAGNOSIS — G301 Alzheimer's disease with late onset: Secondary | ICD-10-CM | POA: Diagnosis not present

## 2021-11-16 DIAGNOSIS — F0390 Unspecified dementia without behavioral disturbance: Secondary | ICD-10-CM | POA: Diagnosis not present

## 2021-11-16 DIAGNOSIS — F333 Major depressive disorder, recurrent, severe with psychotic symptoms: Secondary | ICD-10-CM | POA: Diagnosis not present

## 2021-11-16 DIAGNOSIS — F4481 Dissociative identity disorder: Secondary | ICD-10-CM | POA: Diagnosis not present

## 2021-11-19 ENCOUNTER — Encounter: Payer: Self-pay | Admitting: Family Medicine

## 2021-11-19 DIAGNOSIS — R531 Weakness: Secondary | ICD-10-CM

## 2021-11-19 DIAGNOSIS — E039 Hypothyroidism, unspecified: Secondary | ICD-10-CM

## 2021-11-20 NOTE — Telephone Encounter (Signed)
Noted  

## 2021-11-20 NOTE — Addendum Note (Signed)
Addended by: Gwenyth Ober R on: 11/20/2021 05:00 PM   Modules accepted: Orders

## 2021-11-20 NOTE — Telephone Encounter (Signed)
Patient Daughter called to follow up on labs. I let patient's daughter know that for the Thyroid lab, the message had been routed back to Dr.Burchette and we were waiting to here back. Patient would like a message sent back through mychart when lab has been added so she can take mother [name]

## 2021-11-20 NOTE — Telephone Encounter (Signed)
Patient's daughter called again for thyroid lab. Spoke with Tillie Rung and Tommi Rumps who stated the lab was being added now. I relayed this information to daughter who verbalized understanding and stated that she would take her to [place] first thing in the morning.     Please advise

## 2021-11-21 ENCOUNTER — Other Ambulatory Visit (INDEPENDENT_AMBULATORY_CARE_PROVIDER_SITE_OTHER): Payer: Medicare PPO

## 2021-11-21 ENCOUNTER — Emergency Department (HOSPITAL_COMMUNITY): Payer: Medicare PPO

## 2021-11-21 ENCOUNTER — Encounter (HOSPITAL_COMMUNITY): Payer: Self-pay | Admitting: Emergency Medicine

## 2021-11-21 ENCOUNTER — Emergency Department (HOSPITAL_COMMUNITY)
Admission: EM | Admit: 2021-11-21 | Discharge: 2021-11-21 | Disposition: A | Discharge status: Home / Self Care | Payer: Medicare PPO | Attending: Emergency Medicine | Admitting: Emergency Medicine

## 2021-11-21 ENCOUNTER — Encounter: Payer: Self-pay | Admitting: Family Medicine

## 2021-11-21 ENCOUNTER — Telehealth: Payer: Self-pay

## 2021-11-21 DIAGNOSIS — R531 Weakness: Secondary | ICD-10-CM

## 2021-11-21 DIAGNOSIS — R0902 Hypoxemia: Secondary | ICD-10-CM | POA: Diagnosis not present

## 2021-11-21 DIAGNOSIS — F0390 Unspecified dementia without behavioral disturbance: Secondary | ICD-10-CM | POA: Insufficient documentation

## 2021-11-21 DIAGNOSIS — J449 Chronic obstructive pulmonary disease, unspecified: Secondary | ICD-10-CM | POA: Diagnosis not present

## 2021-11-21 DIAGNOSIS — M533 Sacrococcygeal disorders, not elsewhere classified: Secondary | ICD-10-CM | POA: Diagnosis not present

## 2021-11-21 DIAGNOSIS — M7918 Myalgia, other site: Secondary | ICD-10-CM | POA: Insufficient documentation

## 2021-11-21 DIAGNOSIS — E039 Hypothyroidism, unspecified: Secondary | ICD-10-CM

## 2021-11-21 DIAGNOSIS — Z87891 Personal history of nicotine dependence: Secondary | ICD-10-CM | POA: Insufficient documentation

## 2021-11-21 DIAGNOSIS — Z85118 Personal history of other malignant neoplasm of bronchus and lung: Secondary | ICD-10-CM | POA: Diagnosis not present

## 2021-11-21 DIAGNOSIS — Z7982 Long term (current) use of aspirin: Secondary | ICD-10-CM | POA: Insufficient documentation

## 2021-11-21 DIAGNOSIS — M545 Low back pain, unspecified: Secondary | ICD-10-CM | POA: Diagnosis not present

## 2021-11-21 DIAGNOSIS — I959 Hypotension, unspecified: Secondary | ICD-10-CM | POA: Diagnosis not present

## 2021-11-21 DIAGNOSIS — Z79899 Other long term (current) drug therapy: Secondary | ICD-10-CM | POA: Diagnosis not present

## 2021-11-21 DIAGNOSIS — R000 Tachycardia, unspecified: Secondary | ICD-10-CM | POA: Diagnosis not present

## 2021-11-21 DIAGNOSIS — M25552 Pain in left hip: Secondary | ICD-10-CM | POA: Diagnosis not present

## 2021-11-21 DIAGNOSIS — Z043 Encounter for examination and observation following other accident: Secondary | ICD-10-CM | POA: Diagnosis not present

## 2021-11-21 DIAGNOSIS — W19XXXA Unspecified fall, initial encounter: Secondary | ICD-10-CM | POA: Insufficient documentation

## 2021-11-21 DIAGNOSIS — J439 Emphysema, unspecified: Secondary | ICD-10-CM | POA: Diagnosis not present

## 2021-11-21 LAB — CBC WITH DIFFERENTIAL/PLATELET
Abs Immature Granulocytes: 0.06 10*3/uL (ref 0.00–0.07)
Basophils Absolute: 0.1 10*3/uL (ref 0.0–0.1)
Basophils Absolute: 0.1 10*3/uL (ref 0.0–0.1)
Basophils Relative: 0.7 % (ref 0.0–3.0)
Basophils Relative: 0 %
Eosinophils Absolute: 0 10*3/uL (ref 0.0–0.7)
Eosinophils Absolute: 0.1 10*3/uL (ref 0.0–0.5)
Eosinophils Relative: 0.3 % (ref 0.0–5.0)
Eosinophils Relative: 0 %
HCT: 44.8 % (ref 36.0–46.0)
HCT: 47.3 % — ABNORMAL HIGH (ref 36.0–46.0)
Hemoglobin: 14.7 g/dL (ref 12.0–15.0)
Hemoglobin: 15.2 g/dL — ABNORMAL HIGH (ref 12.0–15.0)
Immature Granulocytes: 0 %
Lymphocytes Relative: 14.3 % (ref 12.0–46.0)
Lymphocytes Relative: 15 %
Lymphs Abs: 2 10*3/uL (ref 0.7–4.0)
Lymphs Abs: 2.4 10*3/uL (ref 0.7–4.0)
MCH: 31.4 pg (ref 26.0–34.0)
MCHC: 32.9 g/dL (ref 30.0–36.0)
MCHC: 32.1 g/dL (ref 30.0–36.0)
MCV: 94.5 fl (ref 78.0–100.0)
MCV: 97.7 fL (ref 80.0–100.0)
Monocytes Absolute: 1.1 10*3/uL — ABNORMAL HIGH (ref 0.1–1.0)
Monocytes Absolute: 1.3 10*3/uL — ABNORMAL HIGH (ref 0.1–1.0)
Monocytes Relative: 7.8 % (ref 3.0–12.0)
Monocytes Relative: 8 %
Neutro Abs: 11 10*3/uL — ABNORMAL HIGH (ref 1.4–7.7)
Neutro Abs: 11.7 10*3/uL — ABNORMAL HIGH (ref 1.7–7.7)
Neutrophils Relative %: 76.9 % (ref 43.0–77.0)
Neutrophils Relative %: 77 %
Platelets: 243 10*3/uL (ref 150.0–400.0)
Platelets: 266 10*3/uL (ref 150–400)
RBC: 4.73 Mil/uL (ref 3.87–5.11)
RBC: 4.84 MIL/uL (ref 3.87–5.11)
RDW: 13.4 % (ref 11.5–15.5)
RDW: 12.8 % (ref 11.5–15.5)
WBC: 14.3 10*3/uL — ABNORMAL HIGH (ref 4.0–10.5)
WBC: 15.4 10*3/uL — ABNORMAL HIGH (ref 4.0–10.5)
nRBC: 0 % (ref 0.0–0.2)

## 2021-11-21 LAB — COMPREHENSIVE METABOLIC PANEL
ALT: 15 U/L (ref 0–44)
AST: 20 U/L (ref 15–41)
Albumin: 3.7 g/dL (ref 3.5–5.0)
Alkaline Phosphatase: 139 U/L — ABNORMAL HIGH (ref 38–126)
Anion gap: 8 (ref 5–15)
BUN: 17 mg/dL (ref 8–23)
CO2: 27 mmol/L (ref 22–32)
Calcium: 8.8 mg/dL — ABNORMAL LOW (ref 8.9–10.3)
Chloride: 106 mmol/L (ref 98–111)
Creatinine, Ser: 0.53 mg/dL (ref 0.44–1.00)
GFR, Estimated: >60 mL/min (ref >60)
Glucose, Bld: 138 mg/dL — ABNORMAL HIGH (ref 70–99)
Potassium: 3.6 mmol/L (ref 3.5–5.1)
Sodium: 141 mmol/L (ref 135–145)
Total Bilirubin: 0.9 mg/dL (ref 0.3–1.2)
Total Protein: 6.7 g/dL (ref 6.5–8.1)

## 2021-11-21 LAB — PROTIME-INR
INR: 1 (ref 0.8–1.2)
Prothrombin Time: 13.1 seconds (ref 11.4–15.2)

## 2021-11-21 LAB — HEPATIC FUNCTION PANEL
ALT: 12 U/L (ref 0–35)
AST: 17 U/L (ref 0–37)
Albumin: 3.9 g/dL (ref 3.5–5.2)
Alkaline Phosphatase: 146 U/L — ABNORMAL HIGH (ref 39–117)
Bilirubin, Direct: 0.2 mg/dL (ref 0.0–0.3)
Total Bilirubin: 0.7 mg/dL (ref 0.2–1.2)
Total Protein: 6.5 g/dL (ref 6.0–8.3)

## 2021-11-21 LAB — TSH: TSH: 0.08 u[IU]/mL — ABNORMAL LOW (ref 0.35–5.50)

## 2021-11-21 LAB — APTT: aPTT: 32 seconds (ref 24–36)

## 2021-11-21 LAB — RENAL FUNCTION PANEL
Albumin: 3.9 g/dL (ref 3.5–5.2)
BUN: 17 mg/dL (ref 6–23)
CO2: 27 mEq/L (ref 19–32)
Calcium: 9.2 mg/dL (ref 8.4–10.5)
Chloride: 104 mEq/L (ref 96–112)
Creatinine, Ser: 0.72 mg/dL (ref 0.40–1.20)
GFR: 79.75 mL/min (ref >60.00)
Glucose, Bld: 118 mg/dL — ABNORMAL HIGH (ref 70–99)
Phosphorus: 4.3 mg/dL (ref 2.3–4.6)
Potassium: 4 mEq/L (ref 3.5–5.1)
Sodium: 140 mEq/L (ref 135–145)

## 2021-11-21 NOTE — ED Triage Notes (Signed)
Per EMS-patient is from home-unwitnessed mechanical fall this morning-unsure of if she hit head or lost consciousness-complaining of left hip, left shoulder and buttock pain

## 2021-11-21 NOTE — ED Provider Notes (Signed)
[facility] DEPT Provider Note   CSN: [id number] Arrival date & time: 11/21/21  1336     History Chief Complaint  Patient presents with   Lytle Michaels    [name] is a 80 y.o. female.  Patient presents ER chief complaint of a fall last night.  History obtained from patient with additional history from family caregivers as patient has severe dementia.  She lives at home with her family.  Family states that they have a camera in her room and she apparently fell sometime between 2 AM and 6 AM.  Patient otherwise has a DNR advanced directive and is requesting comfort care only.  No additional reports of fevers or cough or vomiting or diarrhea provided.      Past Medical History:  Diagnosis Date   Abnormality of gait 05/27/2013   Cancer (HCC)    COPD (chronic obstructive pulmonary disease) (HCC)    Depression    Diplopia    Monocular, OS   Epilepsy (HCC)    Hearing deficit    High cholesterol    Lung cancer ([place])    Memory deficit 05/27/2013   Multiple sclerosis ([place])    Murmur    Scoliosis    Syncope    Urine incontinence     Patient Active Problem List   Diagnosis Date Noted   Acute adjustment disorder with depressed mood 03/22/2020   Recurrent non-small cell lung cancer ([place]) 04/30/2018   Compression fracture of body of thoracic vertebra ([place]) 02/04/2015   Malignant neoplasm of lower lobe of left lung ([place]) 02/03/2015   Toxic effect of tobacco and nicotine 10/19/2013   Abnormality of gait 05/27/2013   Memory deficit 05/27/2013   Leg weakness, bilateral 02/04/2013   Multiple sclerosis ([place]) 02/04/2013   COPD (chronic obstructive pulmonary disease) ([place]) 02/04/2013   Depression 02/04/2013   HLD (hyperlipidemia) 02/04/2013   Anxiety 02/04/2013   Insomnia 02/04/2013   Hypokalemia 02/04/2013   Chronic respiratory failure ([place]) 08/06/2012   Tobacco use disorder 10/31/2011   Patent foramen ovale 10/10/2011    Past Surgical History:   Procedure Laterality Date   ABDOMINAL HYSTERECTOMY  2007   [facility]   CHOLECYSTECTOMY  2006   LUNG BIOPSY  2012   OVARIAN CYST REMOVAL  2008   Skin biopsy Left 12/08/2018   Superior Medial Anterior Ankle - Basal Cell Carcinoma, nodular pattern, ulcerated     OB History   No obstetric history on file.     Family History  Problem Relation Age of Onset   Stroke Mother    Heart disease Mother    Breast cancer Mother    Pancreatitis Mother    Alcohol abuse Mother    Arthritis Mother    Cancer Mother    Hyperlipidemia Mother    Mental illness Mother    Leukemia Father    Heart disease Father    Alcohol abuse Father    Arthritis Father    Cancer Father    Alcohol abuse Sister    Arthritis Sister    COPD Sister    Depression Sister    Alcohol abuse Brother    Diabetes Brother    Kidney disease Brother    Heart disease Brother    Heart attack Brother    Hypertension Brother    Mental illness Brother    Alzheimer's disease Brother    Heart attack Brother    Heart disease Brother  Hypertension Brother    Alcohol abuse Daughter    Cancer Daughter    COPD Daughter    Drug abuse Daughter    Heart disease Maternal Uncle    Diverticulitis Paternal Aunt     Social History   Tobacco Use   Smoking status: Former    Packs/day: 0.25    Types: Cigarettes   Smokeless tobacco: Never  Vaping Use   Vaping Use: Never used  Substance Use Topics   Alcohol use: No   Drug use: No    Home Medications Prior to Admission medications   Medication Sig Start Date End Date Taking? Authorizing Provider  ARIPiprazole (ABILIFY) 2 MG tablet  05/26/20   [provider]  aspirin EC 81 MG tablet Take 81 mg by mouth daily.    [provider]  atorvastatin (LIPITOR) 20 MG tablet Take 1 tablet (20 mg total) by mouth daily. Need for schedule yearly physical for further refills 05/04/21   Nafziger, Tommi Rumps, NP  cephALEXin (KEFLEX) 500 MG  capsule Take 1 capsule (500 mg total) by mouth 3 (three) times daily. 11/12/21   Burchette, Alinda Sierras, MD  clonazePAM (KLONOPIN) 0.5 MG tablet Take 1 tablet (0.5 mg total) by mouth daily. Patient taking differently: Take 0.5 mg by mouth 2 (two) times daily as needed for anxiety. 12/17/14   Tanna Furry, MD  donepezil (ARICEPT) 5 MG tablet Take 1 tablet (5 mg total) by mouth daily after breakfast. 08/27/21   Kathrynn Ducking, MD  haloperidol (HALDOL) 2 MG tablet Take 2 mg by mouth daily as needed. 09/07/21   [provider]  levothyroxine (SYNTHROID) 88 MCG tablet Take 1 tablet (88 mcg total) by mouth daily. 09/27/21   Nafziger, Tommi Rumps, NP  QUEtiapine (SEROQUEL) 50 MG tablet Take 100 mg by mouth at bedtime.    [provider]  traZODone (DESYREL) 50 MG tablet Take 100 mg by mouth at bedtime.    [provider]  venlafaxine XR (EFFEXOR-XR) 75 MG 24 hr capsule Take 1 capsule (75 mg total) by mouth daily. Patient taking differently: daily. 3 capsules daily. 03/22/20   Rankin, Shuvon B, NP    Allergies    Dilantin [phenytoin], Lactose intolerance (gi), Shellfish allergy, and Sulfa antibiotics  Review of Systems   Review of Systems  Unable to perform ROS: Dementia   Physical Exam Updated Vital Signs BP 115/82    Pulse 99    Temp 97.8 F (36.6 C) (Oral)    Resp 16    SpO2 94%   Physical Exam Constitutional:      General: She is not in acute distress.    Appearance: Normal appearance.  HENT:     Head: Normocephalic.     Nose: Nose normal.  Eyes:     Extraocular Movements: Extraocular movements intact.  Cardiovascular:     Rate and Rhythm: Normal rate.  Pulmonary:     Effort: Pulmonary effort is normal.  Abdominal:     Tenderness: There is no abdominal tenderness. There is no guarding or rebound.  Musculoskeletal:        General: Normal range of motion.     Cervical back: Normal range of motion.     Comments: Patient ranging her neck with normal range of motion.   No C or T or L-spine midline step-offs or tenderness noted exam.  Patient ranging bilateral shoulders elbows wrists and hips knees and ankles without pain or discomfort.  Neurological:     Mental Status: She is  alert. Mental status is at baseline.     Motor: No weakness.    ED Results / Procedures / Treatments   Labs (all labs ordered are listed, but only abnormal results are displayed) Labs Reviewed  CBC WITH DIFFERENTIAL/PLATELET - Abnormal; Notable for the following components:      Result Value   WBC 15.4 (*)    Hemoglobin 15.2 (*)    HCT 47.3 (*)    Neutro Abs 11.7 (*)    Monocytes Absolute 1.3 (*)    All other components within normal limits  COMPREHENSIVE METABOLIC PANEL - Abnormal; Notable for the following components:   Glucose, Bld 138 (*)    Calcium 8.8 (*)    Alkaline Phosphatase 139 (*)    All other components within normal limits  APTT  PROTIME-INR    EKG None  Radiology DG Lumbar Spine 2-3 Views  Result Date: 11/21/2021 CLINICAL DATA:  Low back pain after fall. EXAM: LUMBAR SPINE - 2-3 VIEW COMPARISON:  February 04, 2013. FINDINGS: Mild compression deformity of L1 vertebral body is noted consistent with fracture of indeterminate age. No spondylolisthesis is noted. Severe degenerative disc disease is noted at L3-4 and L4-5. IMPRESSION: Mild compression deformity of L1 vertebral body consistent with fracture of indeterminate age. MRI may be performed for further evaluation. Aortic Atherosclerosis (ICD10-I70.0). Electronically Signed   By: Marijo Conception M.D.   On: 11/21/2021 14:54   DG Sacrum/Coccyx  Result Date: 11/21/2021 CLINICAL DATA:  Fall, low back and sacral pain EXAM: SACRUM AND COCCYX - 2+ VIEW COMPARISON:  None. FINDINGS: Sacrum and coccyx are obscured by bowel gas frontal view. No apparent fracture on lateral view. IMPRESSION: Suboptimal evaluation.  No apparent fracture. Electronically Signed   By: Macy Mis M.D.   On: 11/21/2021 14:54   CT Head Wo  Contrast  Result Date: 11/21/2021 CLINICAL DATA:  Fall EXAM: CT HEAD WITHOUT CONTRAST CT CERVICAL SPINE WITHOUT CONTRAST TECHNIQUE: Multidetector CT imaging of the head and cervical spine was performed following the standard protocol without intravenous contrast. Multiplanar CT image reconstructions of the cervical spine were also generated. COMPARISON:  None. FINDINGS: CT HEAD FINDINGS Brain: No evidence of acute infarction, hemorrhage, hydrocephalus, extra-axial collection or mass lesion/mass effect. Vascular: No hyperdense vessel or unexpected calcification. Skull: Normal. Negative for fracture or focal lesion. Sinuses/Orbits: No acute finding. Other: None. CT CERVICAL SPINE FINDINGS Alignment: Normal. Skull base and vertebrae: No acute fracture. No primary bone lesion or focal pathologic process. Soft tissues and spinal canal: No prevertebral fluid or swelling. No visible canal hematoma. Disc levels:  Mild multilevel degenerative disc disease. Upper chest: Emphysema. Other: None. IMPRESSION: 1. No acute intracranial abnormality. 2. No CT evidence of acute cervical spine injury. Electronically Signed   By: Yetta Glassman M.D.   On: 11/21/2021 15:13   CT Cervical Spine Wo Contrast  Result Date: 11/21/2021 CLINICAL DATA:  Fall EXAM: CT HEAD WITHOUT CONTRAST CT CERVICAL SPINE WITHOUT CONTRAST TECHNIQUE: Multidetector CT imaging of the head and cervical spine was performed following the standard protocol without intravenous contrast. Multiplanar CT image reconstructions of the cervical spine were also generated. COMPARISON:  None. FINDINGS: CT HEAD FINDINGS Brain: No evidence of acute infarction, hemorrhage, hydrocephalus, extra-axial collection or mass lesion/mass effect. Vascular: No hyperdense vessel or unexpected calcification. Skull: Normal. Negative for fracture or focal lesion. Sinuses/Orbits: No acute finding. Other: None. CT CERVICAL SPINE FINDINGS Alignment: Normal. Skull base and vertebrae: No  acute fracture. No primary bone lesion or focal pathologic  process. Soft tissues and spinal canal: No prevertebral fluid or swelling. No visible canal hematoma. Disc levels:  Mild multilevel degenerative disc disease. Upper chest: Emphysema. Other: None. IMPRESSION: 1. No acute intracranial abnormality. 2. No CT evidence of acute cervical spine injury. Electronically Signed   By: Yetta Glassman M.D.   On: 11/21/2021 15:13    Procedures Procedures   Medications Ordered in ED Medications - No data to display  ED Course  I have reviewed the triage vital signs and the nursing notes.  Pertinent labs & imaging results that were available during my care of the patient were reviewed by me and considered in my medical decision making (see chart for details).    MDM Rules/Calculators/A&P                         Patient was seen in rapid triage and labs are already been sent.  She is a white count of 15 hemoglobin 15 as well chemistry largely unremarkable.  CT head C-spine and sacral x-rays unremarkable.  X-rays of the L-spine concerning for mild L1 compression fracture.  However clinically the patient has no tenderness and appears to be ranging her torso without discomfort.  She appears comfortable at this time I discussed patient's advanced directive or comfort care and with the family interprets that to mean.  Family states that they just want to take her home.    Recommended continue follow-up with the palliative care physicians.  Advised return if her comfort cares cannot be met at home if they have any additional concerns.     Final Clinical Impression(s) / ED Diagnoses Final diagnoses:  Fall, initial encounter    Rx / DC Orders ED Discharge Orders     None        Luna Fuse, MD 11/21/21 [phone]

## 2021-11-21 NOTE — ED Notes (Signed)
Discharge instructions reviewed w/ pt's daughter/caregiver.  Daughter states understanding of dc instructions, follow up and pain management. Pt assisted into wheelchair and wheeled out to private vehicle. Pt assisted into private vehicle. No belongings left in room upon dc.

## 2021-11-21 NOTE — Telephone Encounter (Signed)
Received message to call patient's daughter. Patient had a fall and is c/o pain. Attempted to reach daughter, call went straight to VM.

## 2021-11-21 NOTE — Discharge Instructions (Addendum)
Call your primary care doctor or specialist as discussed in the next 2-3 days.   Return immediately back to the ER if:  Your symptoms worsen within the next 12-24 hours. You develop new symptoms such as new fevers, persistent vomiting, new pain, shortness of breath, or new weakness or numbness, or if you have any other concerns.  

## 2021-11-21 NOTE — ED Provider Notes (Signed)
Emergency Medicine Provider Triage Evaluation Note  [name] , a 80 y.o. female  was evaluated in triage.  Pt complains of unwitnessed mechanical fall onset 2 AM this morning.  Patient is unsure if she hit her head.  Patient has associated buttock pain.  Patient has not tried any medications for her symptoms.  Denies dizziness, lightheadedness, LOC, chest pain, shortness of breath, abdominal pain, nausea, vomiting.  Review of Systems  Positive: Buttock pain Negative: Chest pain, shortness of breath  Physical Exam  BP 126/64    Pulse 99    Temp 97.8 F (36.6 C) (Oral)    Resp 16    SpO2 94%  Gen:   Awake, no distress   Resp:  Normal effort  MSK:   Moves extremities without difficulty  Other:  Full active range of motion of bilateral hips.  Medical Decision Making  Medically screening exam initiated at 1:51 PM.  Appropriate orders placed.  [name] was informed that the remainder of the evaluation will be completed by another provider, this initial triage assessment does not replace that evaluation, and the importance of remaining in the ED until their evaluation is complete.    Nehemiah Settle, PA-C 11/21/21 1405    Luna Fuse, MD 11/21/21 1622

## 2021-11-22 ENCOUNTER — Telehealth: Payer: Self-pay | Admitting: Adult Health

## 2021-11-22 NOTE — Telephone Encounter (Signed)
Tiffany Reed called from [facility] care, regarding patient. She states patient is now eligible for hospice care and wanted to see if either [facility] or Dr.Burchette would be the attending provider while patient is in hospice care.   Tiffany states a callback to her after a decision has been made would be good.  Her number is [phone]     Please Advise

## 2021-11-22 NOTE — Telephone Encounter (Signed)
I have called and left message on [name]'s voice mail with information and request to call office if any questions.

## 2021-12-03 ENCOUNTER — Encounter (HOSPITAL_COMMUNITY): Payer: Medicare PPO

## 2021-12-03 ENCOUNTER — Ambulatory Visit: Payer: Medicare PPO | Admitting: Surgery

## 2021-12-24 ENCOUNTER — Telehealth: Payer: Self-pay | Admitting: Adult Health

## 2021-12-24 ENCOUNTER — Ambulatory Visit: Payer: Medicare PPO | Admitting: Neurology

## 2021-12-24 NOTE — Telephone Encounter (Signed)
Left message for patient to call back and schedule Medicare Annual Wellness Visit (AWV) either virtually or in office. Left  my Herbie Drape number [phone]   AWV-S PER [place] 02/23/13  please schedule at anytime with [facility] Nurse Health Advisor 1 or 2   This should be a 45 minute visit.

## 2022-01-01 ENCOUNTER — Telehealth: Payer: Self-pay

## 2022-01-02 NOTE — Telephone Encounter (Signed)
Confirmation fax received. No further needed.

## 2022-01-02 NOTE — Telephone Encounter (Signed)
Spoke with [name] and she provided an alternate fax number [phone]. Fax sent awaiting confirmation.

## 2022-01-05 ENCOUNTER — Encounter: Payer: Self-pay | Admitting: Adult Health

## 2022-01-07 NOTE — Telephone Encounter (Signed)
FYI

## 2022-01-23 NOTE — Telephone Encounter (Signed)
Received fax info regarding treatment plan for pt. However, the fax number provided [phone] was not working. Tried faxing 2/6-2/7 but line was busy. Called [phone] for assistance but no answer. Will try again tomorrow.

## 2022-01-23 DEATH — deceased

## 2022-02-28 ENCOUNTER — Ambulatory Visit: Payer: Medicare PPO | Admitting: Neurology

## 2022-08-01 IMAGING — CT CT CERVICAL SPINE W/O CM
4 of 8 series · 13 of 35 positions shown, 14 images · non-contrast
Comparison: None.

CLINICAL DATA: Fall

EXAM:
CT HEAD WITHOUT CONTRAST
CT CERVICAL SPINE WITHOUT CONTRAST
TECHNIQUE: Multidetector CT imaging of the head and cervical spine was
performed following the standard protocol without intravenous
contrast. Multiplanar CT image reconstructions of the cervical spine
were also generated.

[Series 4: c spine soft · axial · 0.26mm/px · z∈[-258,-202]mm · 2 of 84 slices shown]
[im 28/84  soft-tissue]
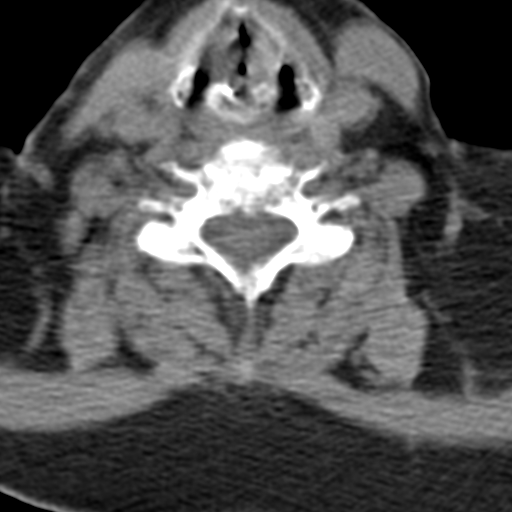
[im 56/84  soft-tissue]
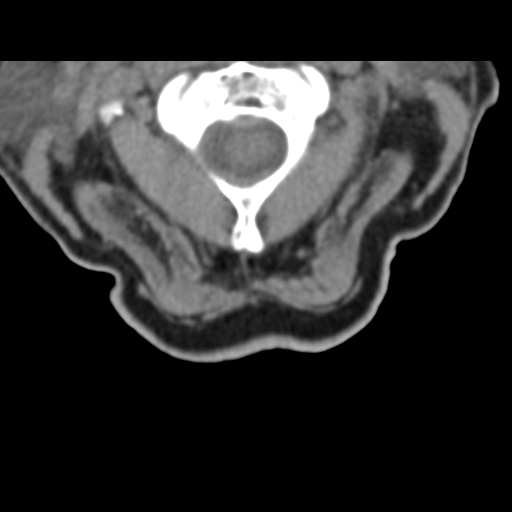

[Series 5: orthogonal bone · axial · 0.23mm/px · z∈[-290,-214]mm · 3 of 83 slices shown, 4 images]
[im 21/83  soft-tissue]
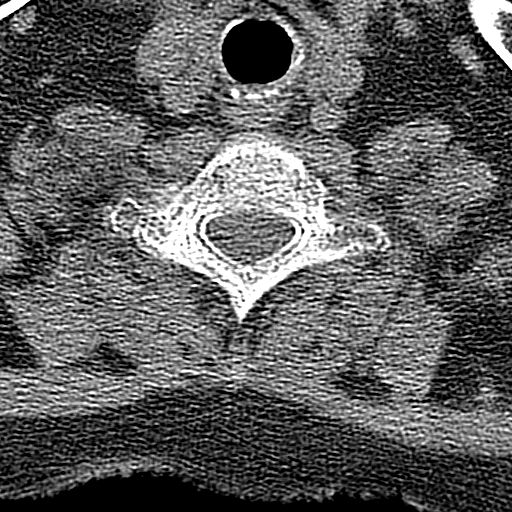
[im 21/83  bone]
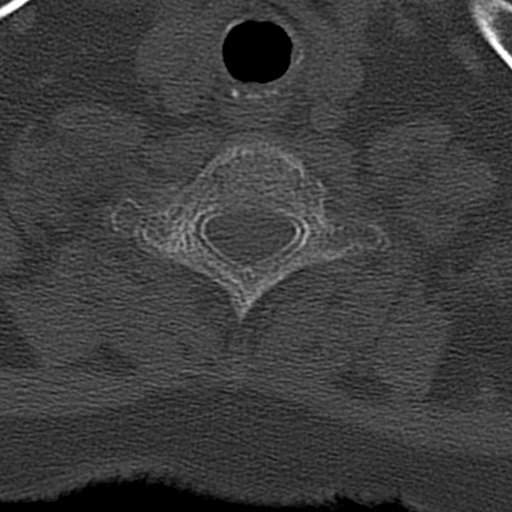
[im 42/83  bone]
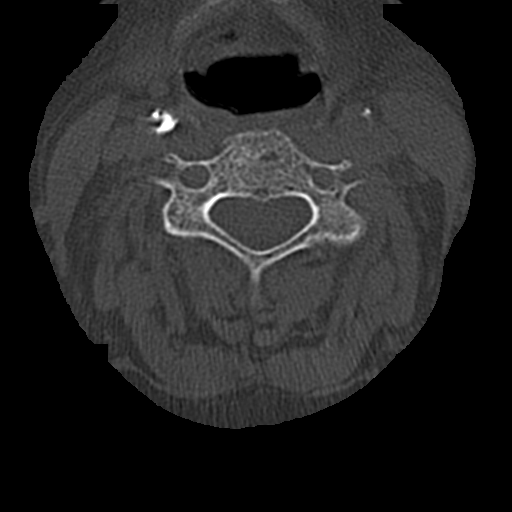
[im 62/83  bone]
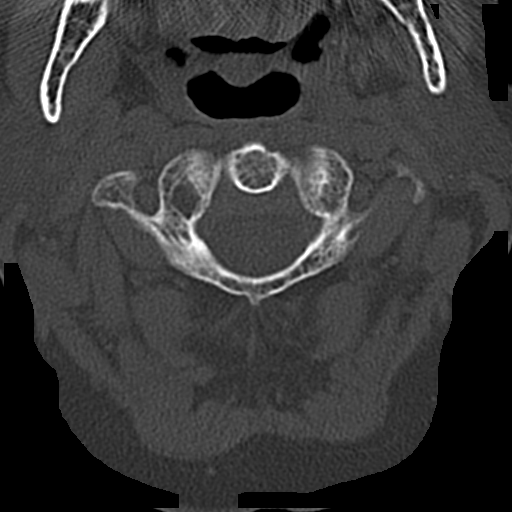

[Series 6: coronal bone · coronal · 0.23mm/px · 3 of 60 slices shown]
[im 9/60  bone]
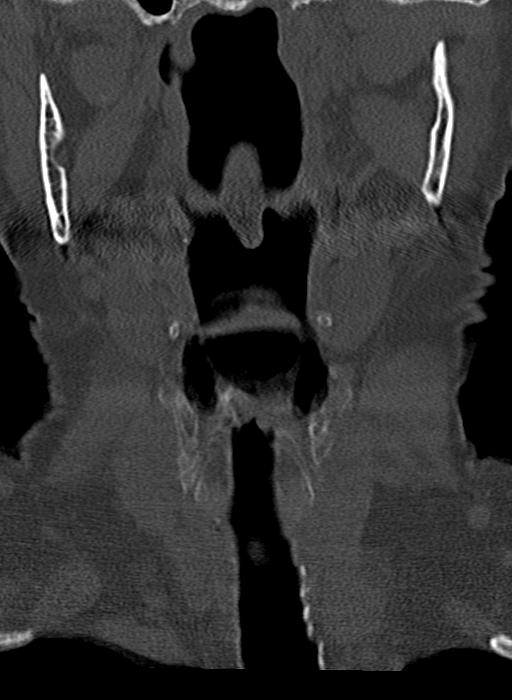
[im 18/60  bone]
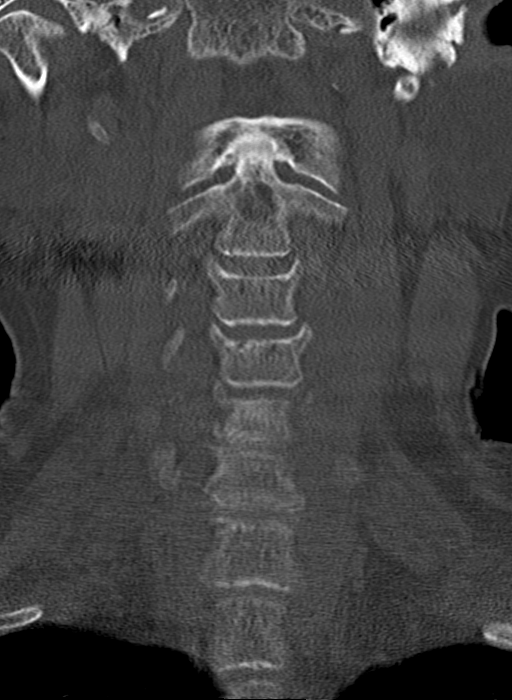
[im 27/60  bone]
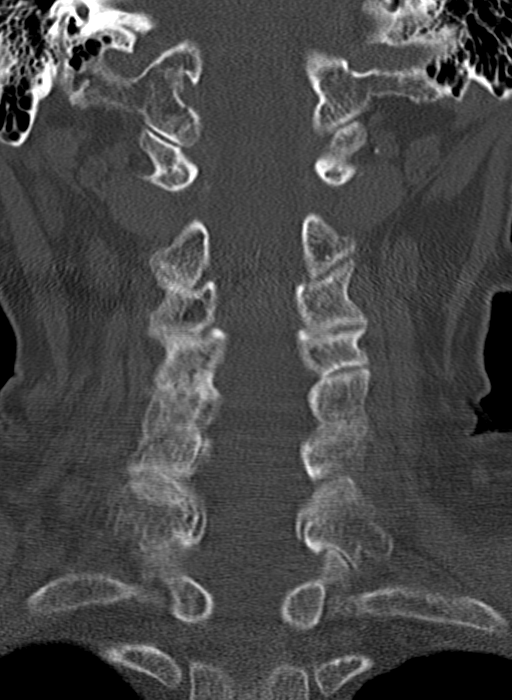

[Series 7: sagittal bone · sagittal · 0.24mm/px · 5 of 43 slices shown]
[im 8/43  bone]
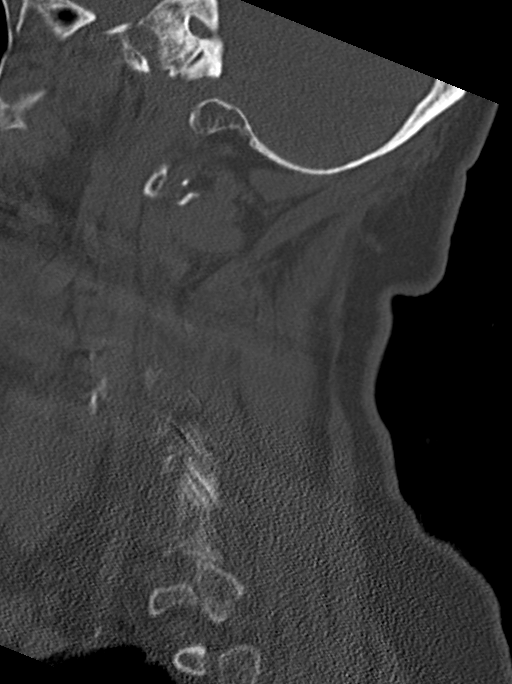
[im 10/43  bone]
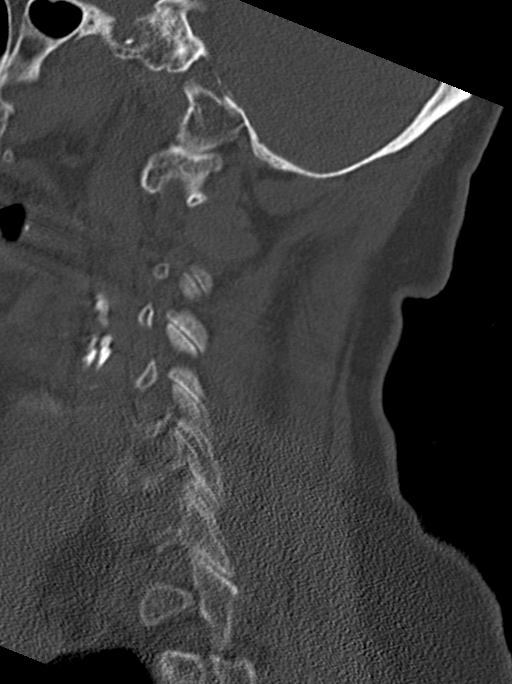
[im 11/43  bone]
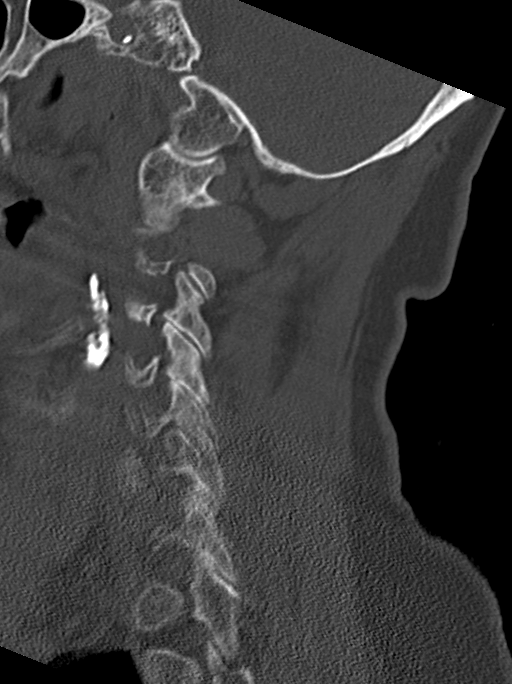
[im 13/43  bone]
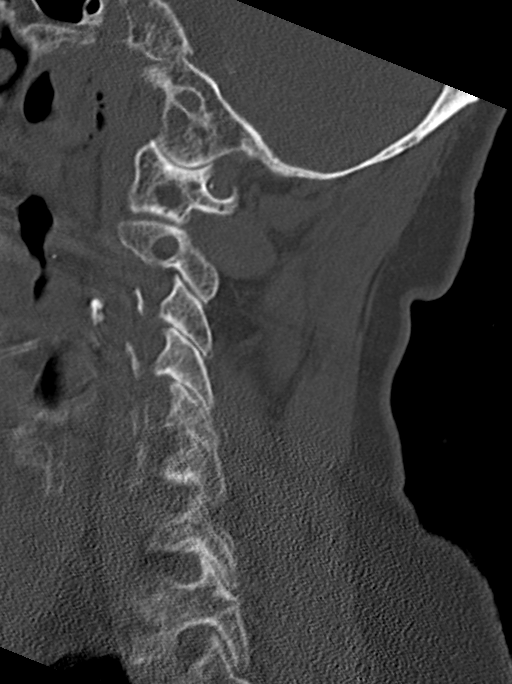
[im 15/43  bone]
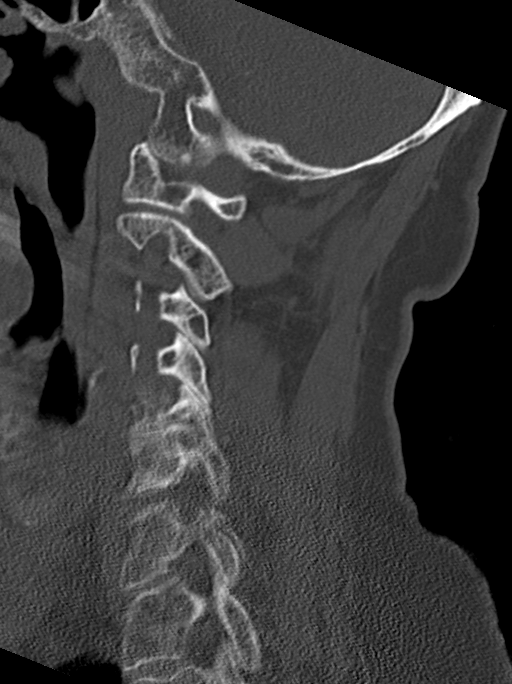

[13 of 35 positions shown; findings below may reference images not displayed]

FINDINGS: CT HEAD FINDINGS

Brain: No evidence of acute infarction, hemorrhage, hydrocephalus,
extra-axial collection or mass lesion/mass effect.

Vascular: No hyperdense vessel or unexpected calcification.

Skull: Normal. Negative for fracture or focal lesion.

Sinuses/Orbits: No acute finding.

Other: None.

CT CERVICAL SPINE FINDINGS

Alignment: Normal.

Skull base and vertebrae: No acute fracture. No primary bone lesion
or focal pathologic process.

Soft tissues and spinal canal: No prevertebral fluid or swelling. No
visible canal hematoma.

Disc levels:  Mild multilevel degenerative disc disease.

Upper chest: Emphysema.

Other: None.
IMPRESSION: 1. No acute intracranial abnormality.
2. No CT evidence of acute cervical spine injury.

## 2022-08-01 IMAGING — CR DG SACRUM/COCCYX 2+V
3 series · 3 of 3 positions shown · non-contrast
Comparison: None.

CLINICAL DATA: Fall, low back and sacral pain

EXAM:
SACRUM AND COCCYX - 2+ VIEW

[t sacrum ap]
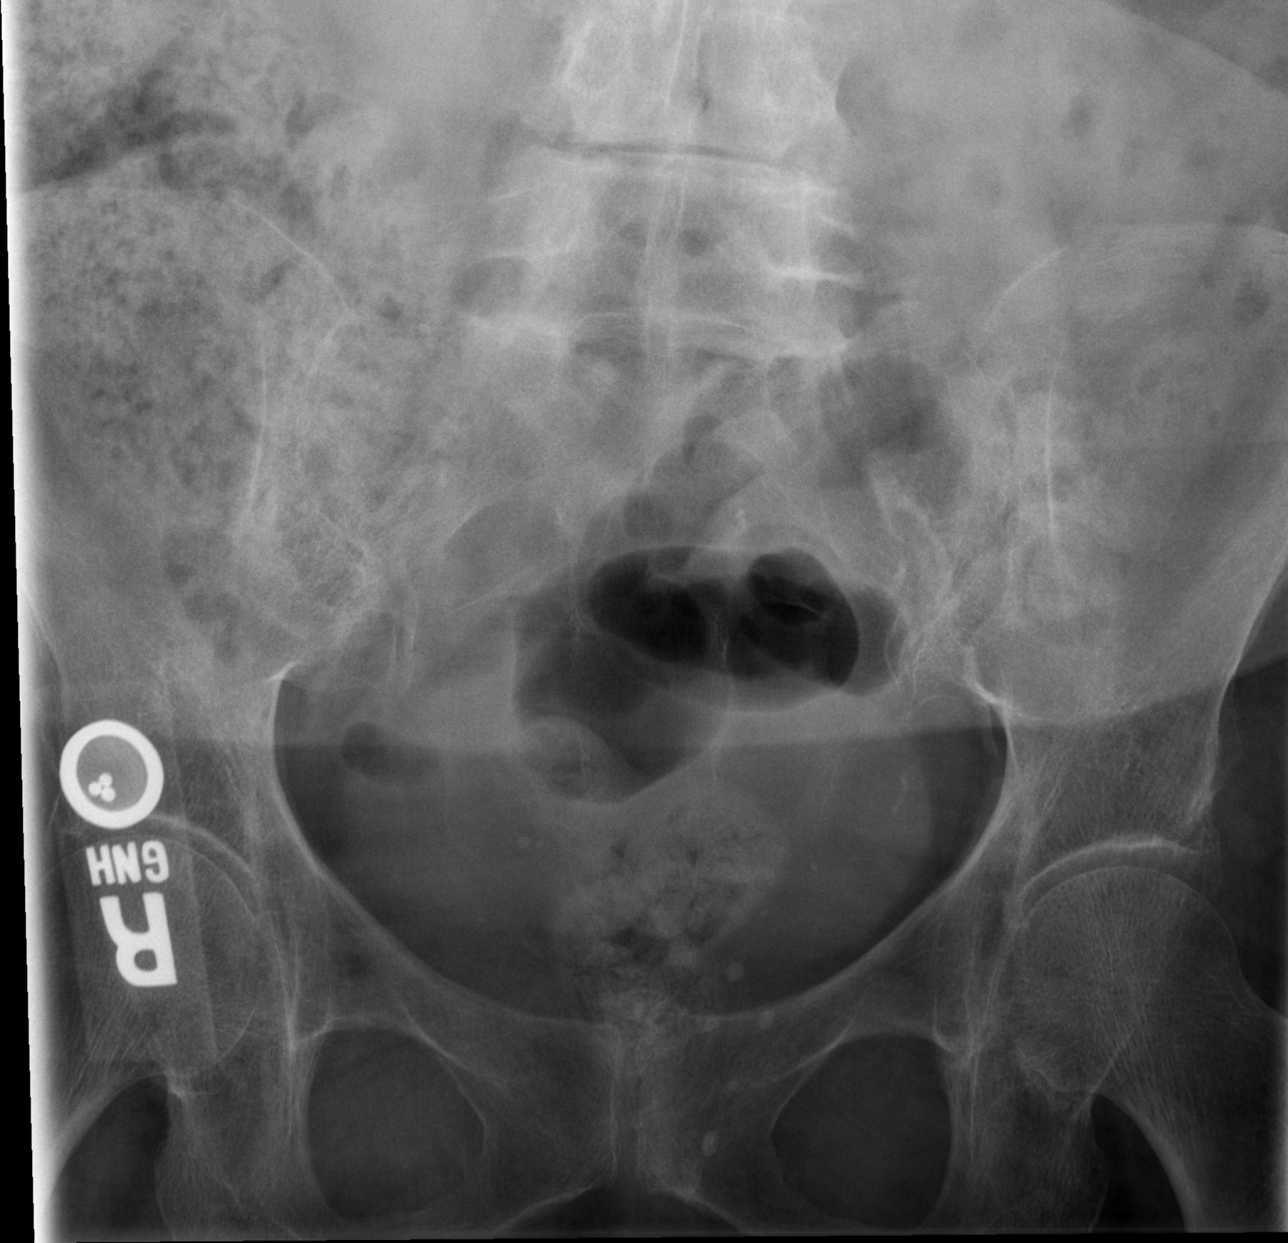

[t coccyx ap]
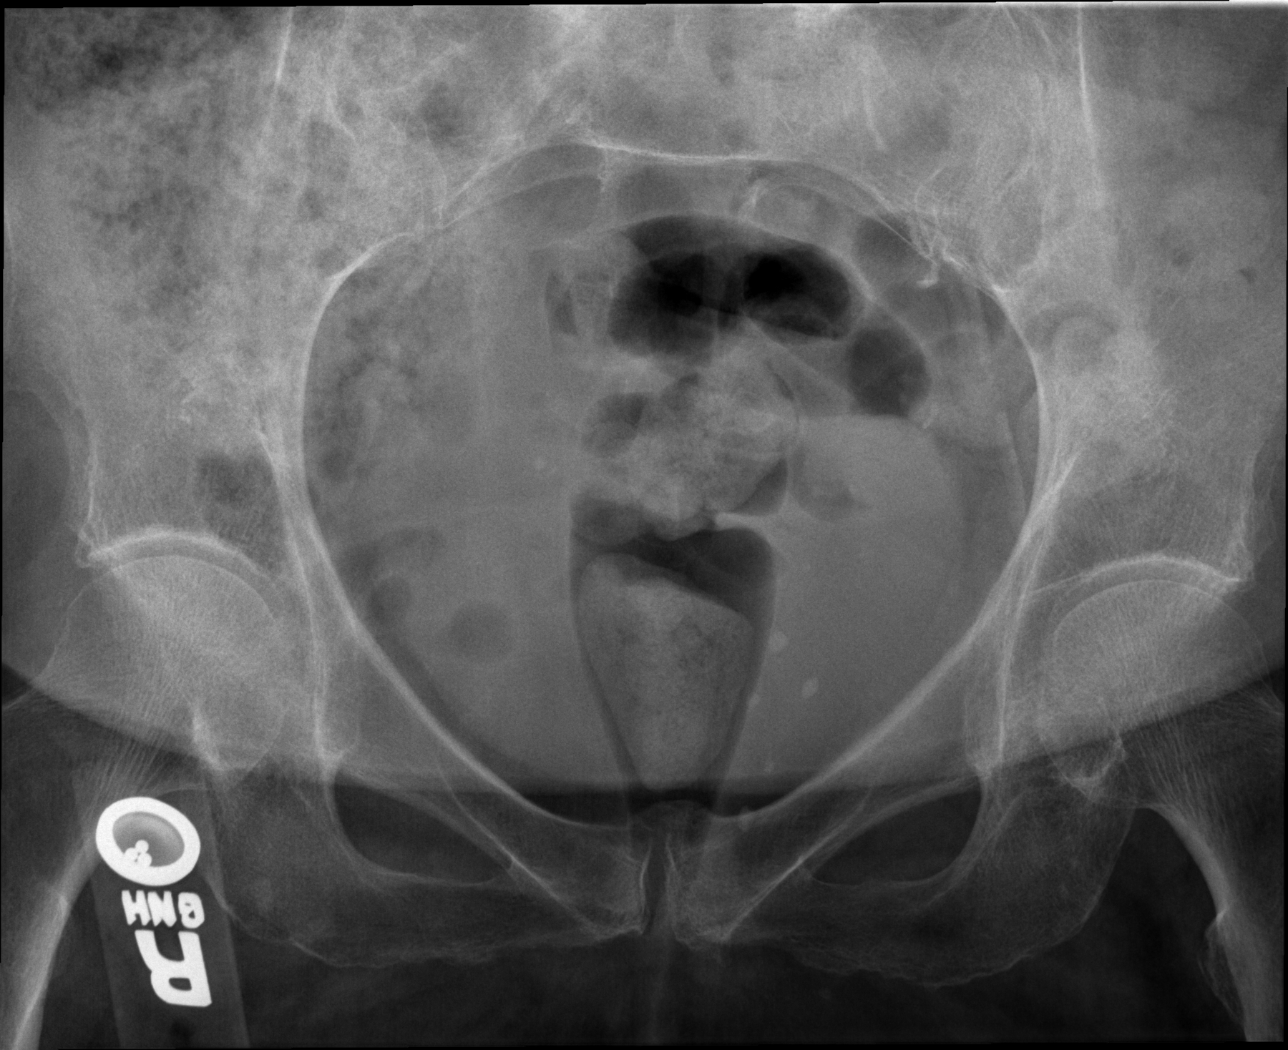

[t sacrum coccyx lat]
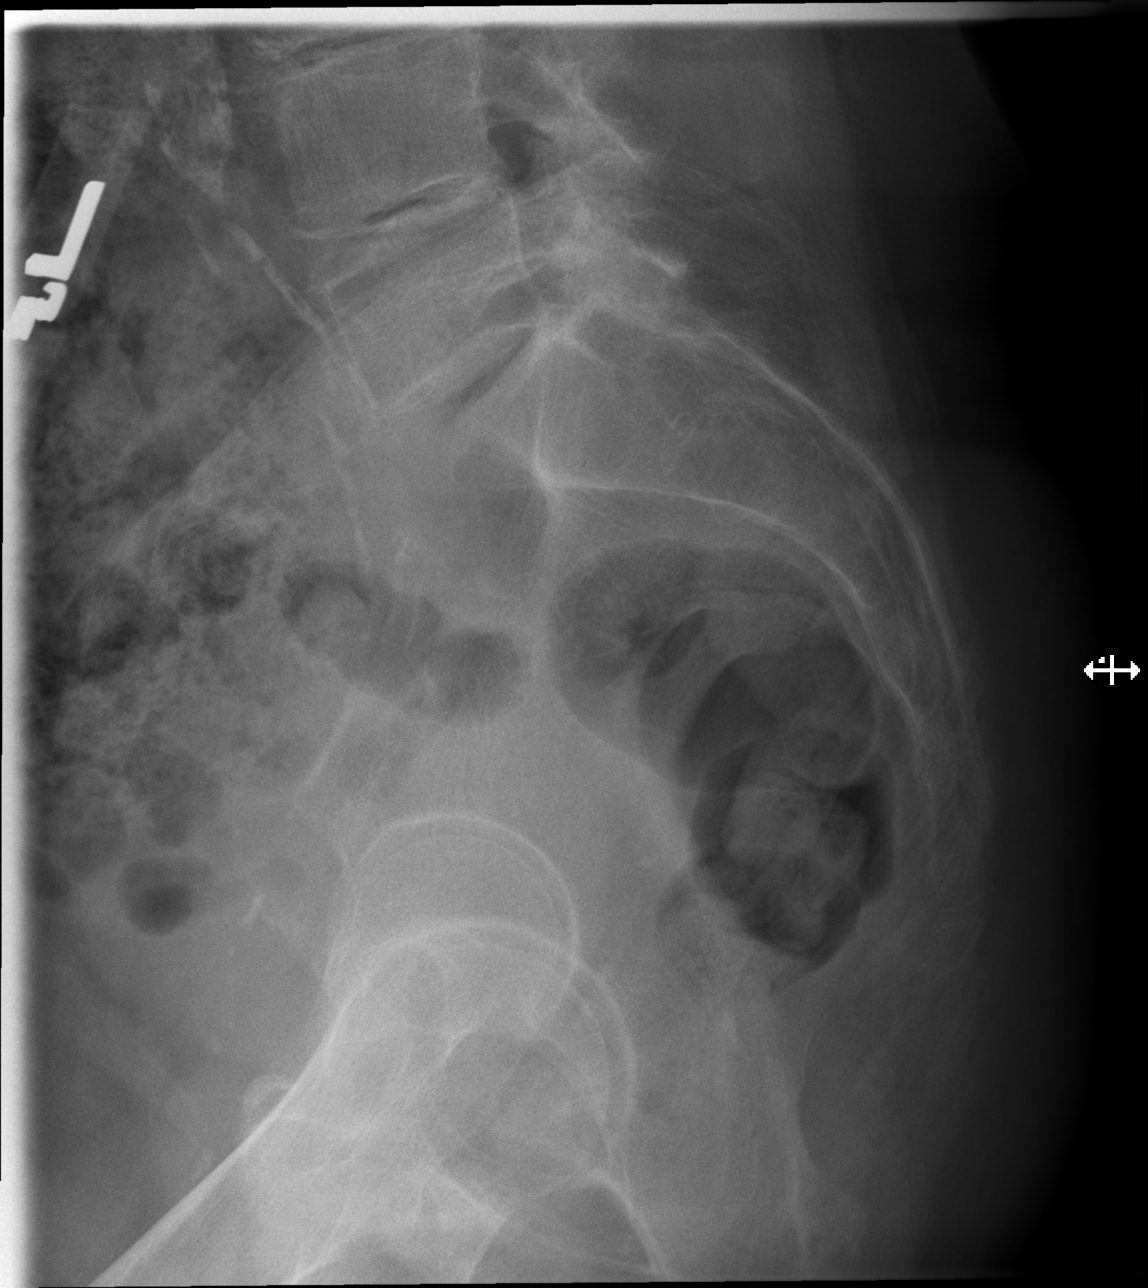

[3 of 3 positions shown; findings below may reference images not displayed]

FINDINGS: Sacrum and coccyx are obscured by bowel gas frontal view. No
apparent fracture on lateral view.
IMPRESSION: Suboptimal evaluation.  No apparent fracture.
